# Patient Record
Sex: Female | Born: 1950 | Race: White | Hispanic: No | Marital: Married | Smoking: Never smoker
Health system: Southern US, Community
[De-identification: ages and names within clinical notes are randomized; demographics above are authoritative.]

## PROBLEM LIST (undated history)

## (undated) DIAGNOSIS — E039 Hypothyroidism, unspecified: Secondary | ICD-10-CM

## (undated) DIAGNOSIS — Z860100 Personal history of colon polyps, unspecified: Secondary | ICD-10-CM

## (undated) DIAGNOSIS — F419 Anxiety disorder, unspecified: Secondary | ICD-10-CM

## (undated) DIAGNOSIS — R519 Headache, unspecified: Secondary | ICD-10-CM

## (undated) DIAGNOSIS — E785 Hyperlipidemia, unspecified: Secondary | ICD-10-CM

## (undated) DIAGNOSIS — K219 Gastro-esophageal reflux disease without esophagitis: Secondary | ICD-10-CM

## (undated) DIAGNOSIS — M1990 Unspecified osteoarthritis, unspecified site: Secondary | ICD-10-CM

## (undated) DIAGNOSIS — F32A Depression, unspecified: Secondary | ICD-10-CM

## (undated) HISTORY — PX: BLADDER REPAIR: SHX76

## (undated) HISTORY — PX: ABDOMINAL HYSTERECTOMY: SHX81

## (undated) HISTORY — PX: OTHER SURGICAL HISTORY: SHX169

## (undated) HISTORY — DX: Hyperlipidemia, unspecified: E78.5

## (undated) HISTORY — DX: Personal history of colon polyps, unspecified: Z86.0100

## (undated) HISTORY — PX: CHOLECYSTECTOMY: SHX55

## (undated) HISTORY — DX: Personal history of colonic polyps: Z86.010

---

## 1979-04-14 HISTORY — PX: TONSILLECTOMY: SUR1361

## 1999-09-12 ENCOUNTER — Encounter: Payer: Self-pay | Admitting: Obstetrics & Gynecology

## 1999-09-12 ENCOUNTER — Encounter: Admission: RE | Admit: 1999-09-12 | Discharge: 1999-09-12 | Payer: Self-pay | Admitting: Obstetrics & Gynecology

## 2001-03-31 ENCOUNTER — Other Ambulatory Visit: Admission: RE | Admit: 2001-03-31 | Discharge: 2001-03-31 | Payer: Self-pay | Admitting: Obstetrics & Gynecology

## 2002-04-28 ENCOUNTER — Other Ambulatory Visit: Admission: RE | Admit: 2002-04-28 | Discharge: 2002-04-28 | Payer: Self-pay | Admitting: Obstetrics & Gynecology

## 2003-07-25 ENCOUNTER — Other Ambulatory Visit: Admission: RE | Admit: 2003-07-25 | Discharge: 2003-07-25 | Payer: Self-pay | Admitting: Obstetrics & Gynecology

## 2004-09-16 ENCOUNTER — Ambulatory Visit (HOSPITAL_BASED_OUTPATIENT_CLINIC_OR_DEPARTMENT_OTHER): Admission: RE | Admit: 2004-09-16 | Discharge: 2004-09-16 | Payer: Self-pay | Admitting: Orthopedic Surgery

## 2004-11-13 ENCOUNTER — Other Ambulatory Visit: Admission: RE | Admit: 2004-11-13 | Discharge: 2004-11-13 | Payer: Self-pay | Admitting: Obstetrics & Gynecology

## 2009-10-08 ENCOUNTER — Encounter: Admission: RE | Admit: 2009-10-08 | Discharge: 2009-10-08 | Payer: Self-pay | Admitting: Orthopedic Surgery

## 2010-08-29 NOTE — Op Note (Signed)
NAMEKENNLEY, Deborah Hanna             ACCOUNT NO.:  [id number]   MEDICAL RECORD NO.:  [id number]          PATIENT TYPE:  AMB   LOCATION:  DSC                          FACILITY:  [facility]   PHYSICIAN:  Katy Fitch. Sypher, M.D. DATE OF BIRTH:  1950/12/27   DATE OF PROCEDURE:  09/16/2004  DATE OF DISCHARGE:  09/16/2004                                 OPERATIVE REPORT   PREOPERATIVE DIAGNOSIS:  Foreign body granuloma with retained wood tooth  pick fragment, left index finger, radial pulp.   POSTOPERATIVE DIAGNOSIS:  Foreign body granuloma with retained wood tooth  pick fragment, left index finger, radial pulp.   OPERATION:  Resection of foreign body granuloma and wooden foreign body left  index radial pulp.   SURGEON:  Katy Fitch. Sypher, M.D.   ASSISTANT:  Molly Maduro Dasnoit, P.A.-C.   ANESTHESIA:  2% lidocaine metacarpal head level block left index finger, no  sedation was otherwise provided, this was performed in the minor operating  room.   INDICATIONS FOR PROCEDURE:  [name] is a 60 year old woman who,  several weeks prior, pierced the radial aspect of her left index finger with  a tooth pick.  She attempted to remove this at home but was unsuccessful.  She has subsequently developed a firm mass on the radial aspect of her left  index finger distal segment that had drained intermittently.  She sought a  hand surgery consult seeking removal.  After informed consent, she is  brought to the operating room at this time.   PROCEDURE:  Dulse Rutan is brought to the operating room and placed in  the supine position on the operating table.  Following Betadine prep of the  finger, 2% lidocaine was infiltrated at the metacarpal head level to obtain  a digital block.  When anesthesia was satisfactory, the finger and hand were  prepped with Betadine solution and sterilely draped.  The index finger was  exsanguinated with a  gauze wrap and a 1/2 inch Penrose drain placed at the  base of  the finger as a digital tourniquet.  The procedure commenced with an  elliptical excision of skin directly over the granuloma.  The subcutaneous  tissues were carefully divided revealing an obvious foreign body granuloma.  The wood foreign body was easily removed.  A micro-rongeur was used to  remove granulation tissue.  The wound was closed with a simple single stitch  of 5-0 nylon.  The finger was then Xeroform, sterile gauze, and Coban.  The  tourniquet was released with immediate capillary refill.  Ms. Ralls is  advised to elevate her hand for five days.  She is given Darvocet N100, 1 p.o. q.4-6h. p.r.n. pain, 20 tablets without  refill.  She will return to our office for follow up in one week.  She has  been advised that on Friday, September 19, 2004, she may remove the bandage and  place the Band-Aid.       RVS/MEDQ  D:  09/18/2004  T:  09/18/2004  Job:  [id number]

## 2012-04-19 ENCOUNTER — Other Ambulatory Visit: Payer: Self-pay | Admitting: Obstetrics & Gynecology

## 2012-04-19 DIAGNOSIS — R928 Other abnormal and inconclusive findings on diagnostic imaging of breast: Secondary | ICD-10-CM

## 2012-04-27 ENCOUNTER — Ambulatory Visit
Admission: RE | Admit: 2012-04-27 | Discharge: 2012-04-27 | Disposition: A | Discharge status: Home / Self Care | Payer: Commercial Managed Care - PPO | Source: Ambulatory Visit | Attending: Obstetrics & Gynecology | Admitting: Obstetrics & Gynecology

## 2012-04-27 DIAGNOSIS — R928 Other abnormal and inconclusive findings on diagnostic imaging of breast: Secondary | ICD-10-CM

## 2012-11-14 HISTORY — PX: COLONOSCOPY: SHX5424

## 2012-12-19 HISTORY — PX: ESOPHAGOGASTRODUODENOSCOPY: SHX1529

## 2017-08-26 ENCOUNTER — Other Ambulatory Visit: Payer: Self-pay | Admitting: Orthopedic Surgery

## 2017-08-26 DIAGNOSIS — M67432 Ganglion, left wrist: Secondary | ICD-10-CM

## 2017-08-27 ENCOUNTER — Ambulatory Visit
Admission: RE | Admit: 2017-08-27 | Discharge: 2017-08-27 | Disposition: A | Discharge status: Home / Self Care | Payer: Medicare Other | Source: Ambulatory Visit | Attending: Orthopedic Surgery | Admitting: Orthopedic Surgery

## 2017-08-27 DIAGNOSIS — M67432 Ganglion, left wrist: Secondary | ICD-10-CM

## 2018-11-01 IMAGING — US US EXTREM UP*L* COMP
1 series · 13 of 13 positions shown · non-contrast
Comparison: None.

CLINICAL DATA: Pain and mass at the volar radial aspect of the left
wrist.

EXAM:
ULTRASOUND LEFT UPPER EXTREMITY COMPLETE
TECHNIQUE: Ultrasound examination was performed including evaluation of the
muscles, tendons, joint, and adjacent soft tissues.

[Series 1: us extrem up*left* comp · 13 acquisitions, 13 frames shown]
[im 1/13]
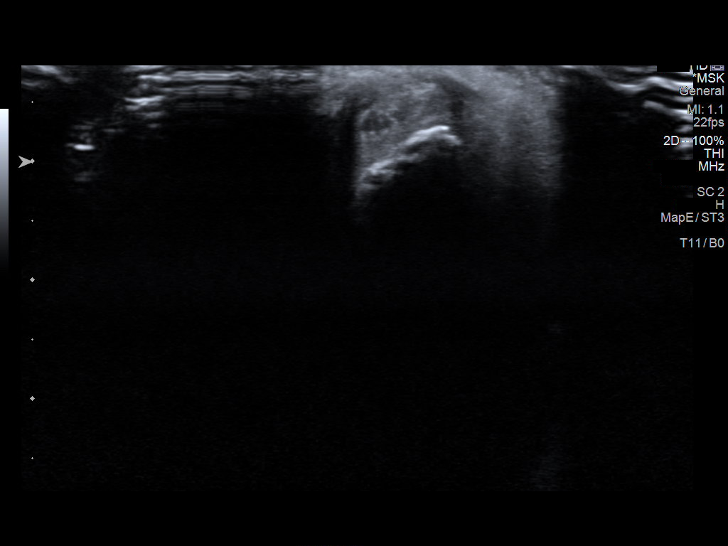
[im 2/13]
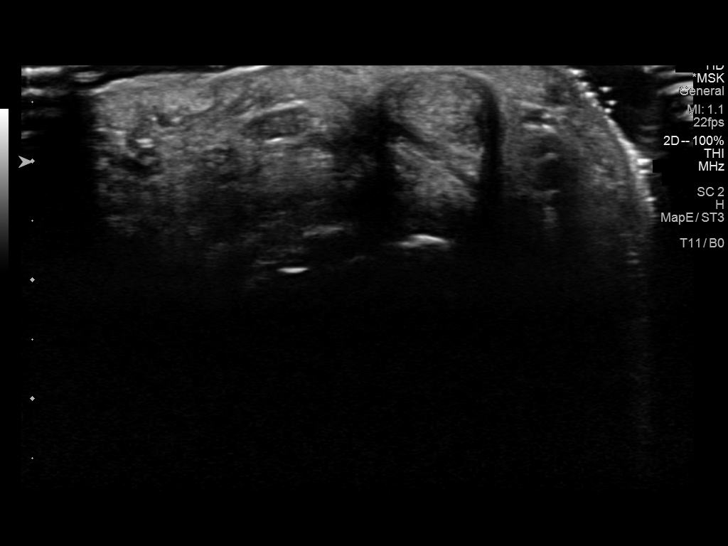
[im 3/13]
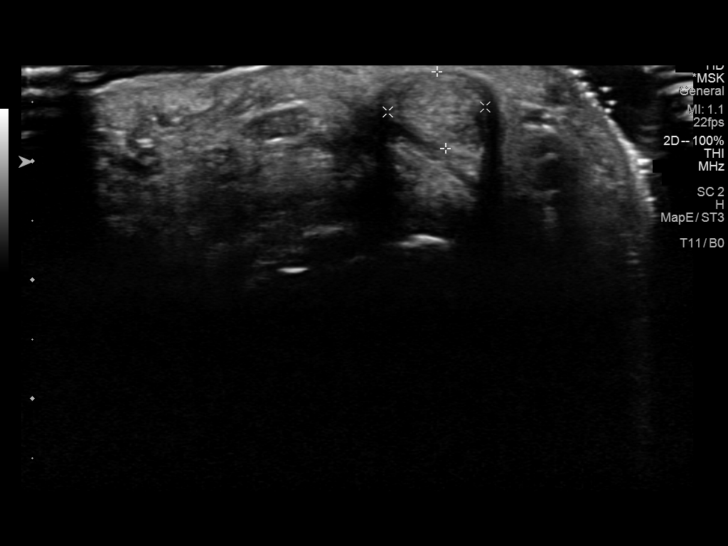
[im 4/13]
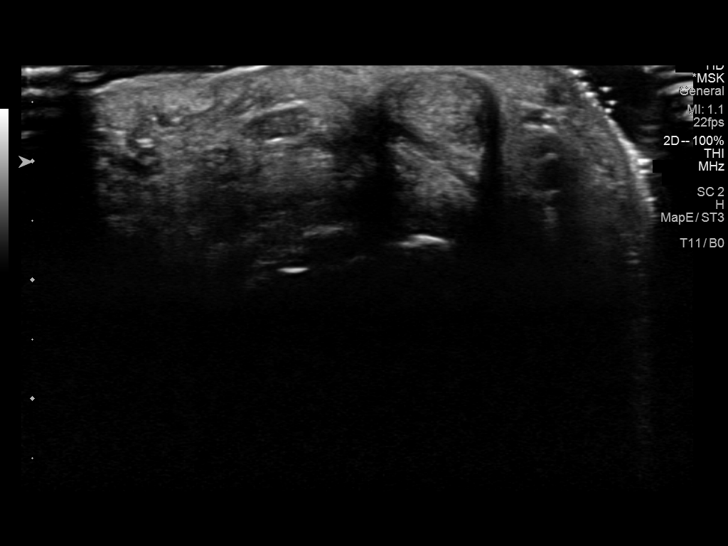
[im 5/13]
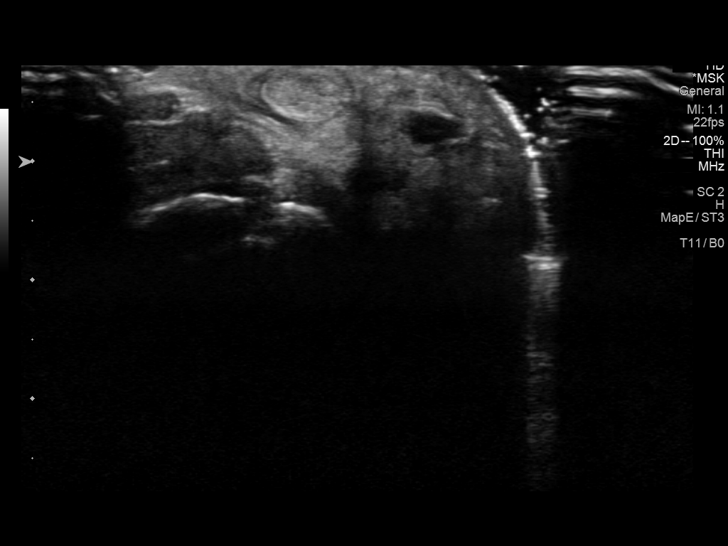
[im 6/13]
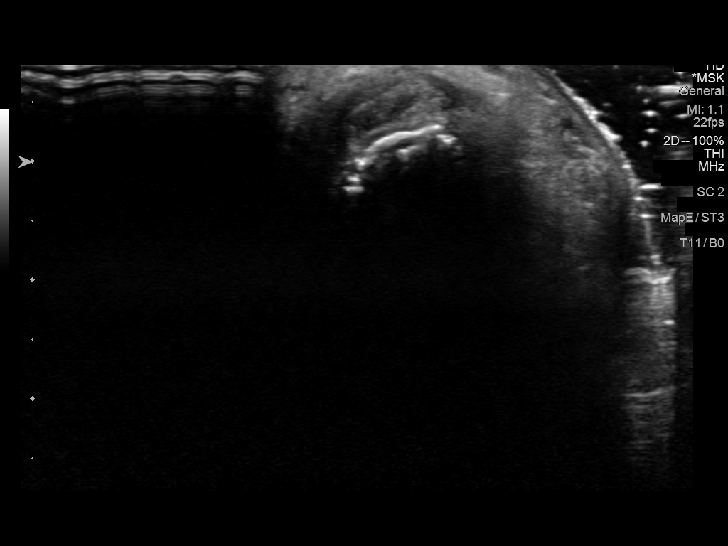
[im 7/13]
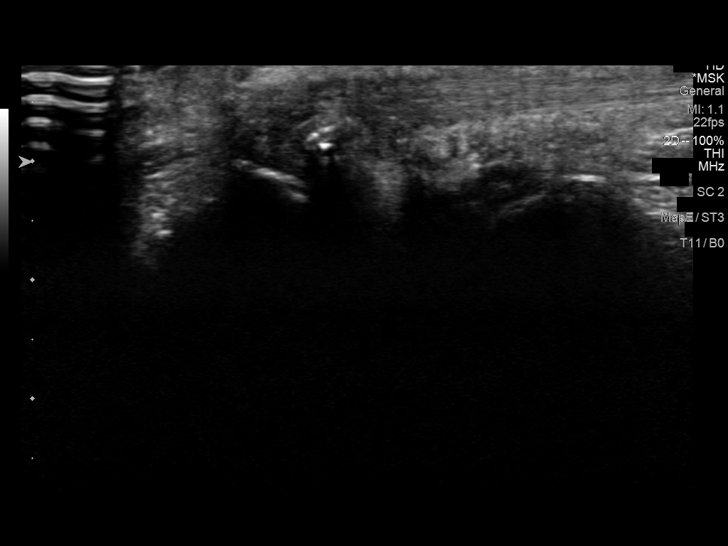
[im 8/13]
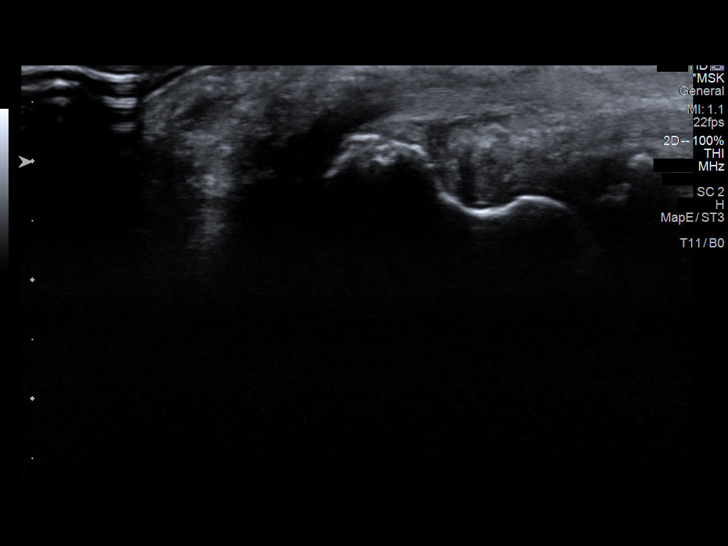
[im 9/13]
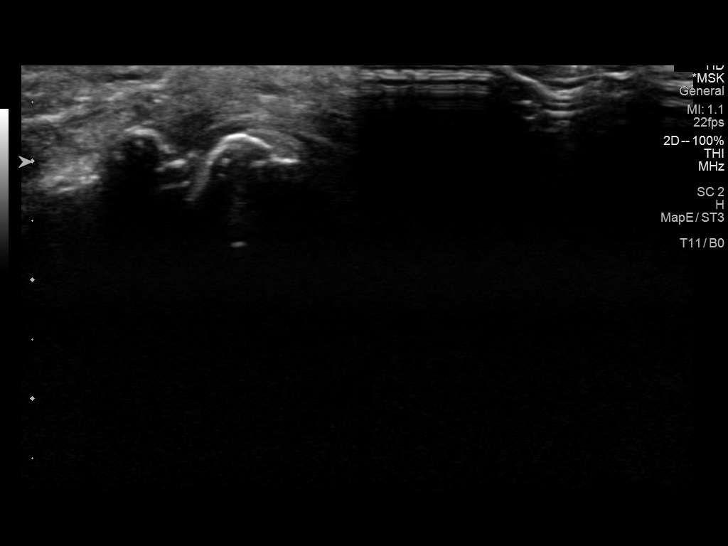
[im 10/13]
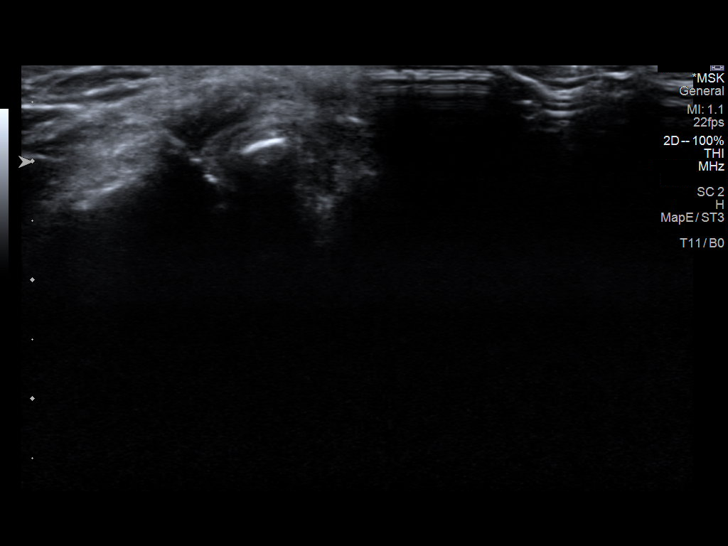
[im 11/13]
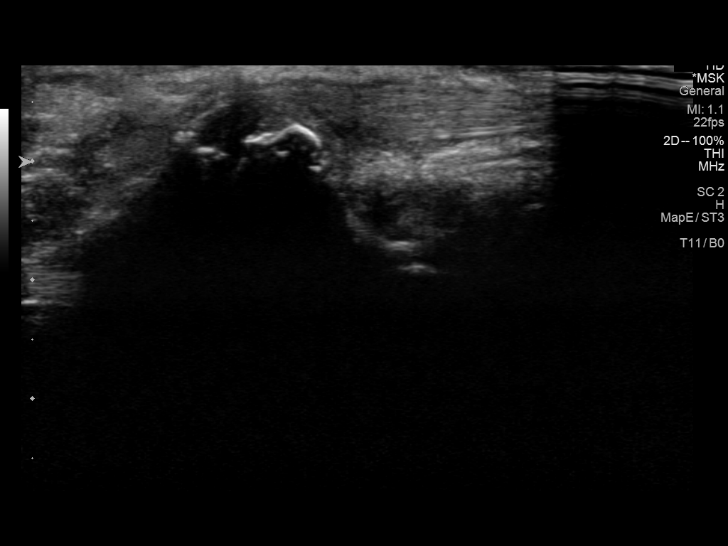
[im 12/13]
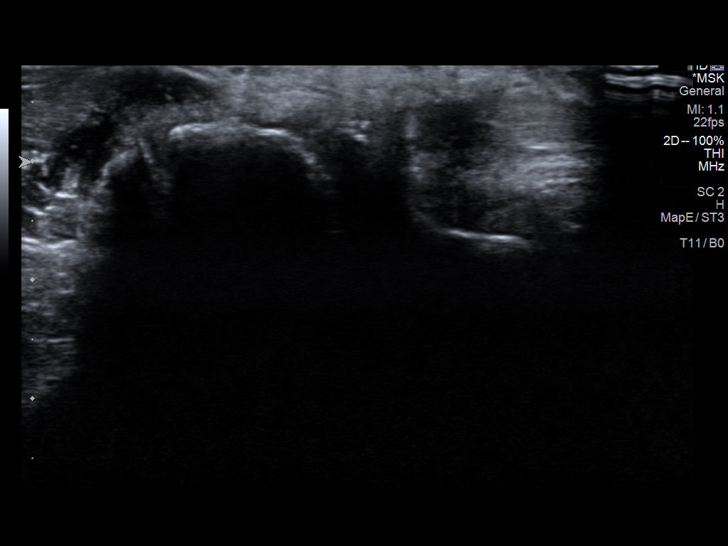
[im 13/13]
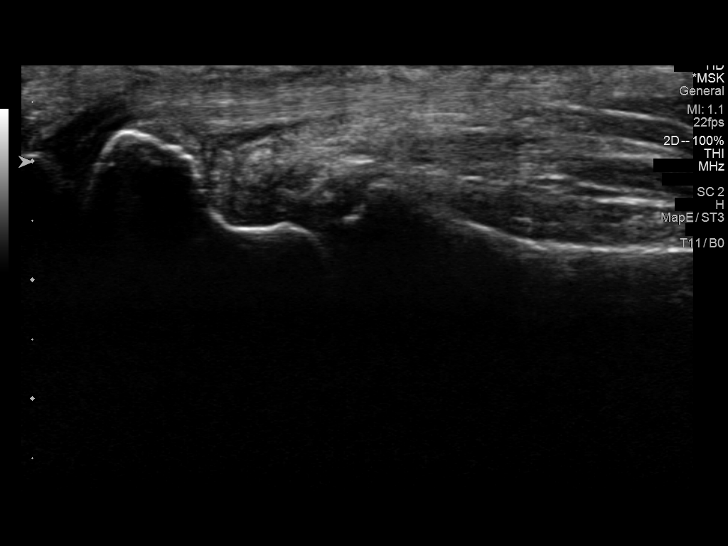

[13 of 13 positions shown; findings below may reference images not displayed]

FINDINGS: Tendons: There is hypertrophy of flexor pollicis longus tendon at
the at the level of radiocarpal joint. This accounts for the soft
tissue prominence. The tendon measures approximately 8 mm maximum
diameter. There is a small amount of fluid in the tendon sheath.
There is prominence of the distal scaphoid and of the volar aspect
of trapezium just deep to the tendon best seen on image 14.

Other Soft Tissue Structures: There is no ganglion cyst or other
soft tissue mass. No appreciable joint effusions.
IMPRESSION: 1. The palpable abnormality of concern represents the hypertrophied
flexor pollicis longus tendon with a small amount of fluid in the
tendon sheath.
2. The area appears more prominent because of underlying osteophytes
on the volar aspect of the distal scaphoid and the adjacent
trapezium.
3. No appreciable ganglion cyst or joint effusion.

## 2021-03-13 ENCOUNTER — Other Ambulatory Visit: Payer: Self-pay | Admitting: Orthopedic Surgery

## 2021-03-14 NOTE — Progress Notes (Signed)
Sent message, via epic in basket, requesting orders in epic from surgeon.  

## 2021-03-21 NOTE — Progress Notes (Signed)
DUE TO COVID-19 ONLY ONE VISITOR IS ALLOWED TO COME WITH YOU AND STAY IN THE WAITING ROOM ONLY DURING PRE OP AND PROCEDURE DAY OF SURGERY.  2 VISITOR  MAY VISIT WITH YOU AFTER SURGERY IN YOUR PRIVATE ROOM DURING VISITING HOURS ONLY!  YOU NEED TO HAVE A COVID 19 TEST ON______AME@_  @_from  8am-3pm _____, THIS TEST MUST BE DONE BEFORE SURGERY,  Covid test is done at [place], [place] [address].  This is a drive thru.  No appt required. Please see map.                 Your procedure is scheduled on:    Report to [place]  Entrance   Report to admitting at AM     Call this number if you have problems the morning of surgery [phone]    REMEMBER: NO  SOLID FOOD CANDY OR GUM AFTER MIDNIGHT. CLEAR LIQUIDS UNTIL         . NOTHING BY MOUTH EXCEPT CLEAR LIQUIDS UNTIL    . PLEASE FINISH ENSURE DRINK PER SURGEON ORDER  WHICH NEEDS TO BE COMPLETED AT      .      CLEAR LIQUID DIET   Foods Allowed                                                                    Coffee and tea, regular and decaf                            Fruit ices (not with fruit pulp)                                      Iced Popsicles                                    Carbonated beverages, regular and diet                                    Cranberry, grape and apple juices Sports drinks like Gatorade Lightly seasoned clear broth or consume(fat free) Sugar, honey syrup ___________________________________________________________________      BRUSH YOUR TEETH MORNING OF SURGERY AND RINSE YOUR MOUTH OUT, NO CHEWING GUM CANDY OR MINTS.     Take these medicines the morning of surgery with A SIP OF WATER:   DO NOT TAKE ANY DIABETIC MEDICATIONS DAY OF YOUR SURGERY                               You may not have any metal on your body including hair pins and              piercings  Do not wear jewelry, make-up, lotions, powders or perfumes, deodorant             Do not wear nail polish  on your fingernails.  Do not shave  48  hours prior to surgery.              Men may shave face and neck.   Do not bring valuables to the hospital. [place].  Contacts, dentures or bridgework may not be worn into surgery.  Leave suitcase in the car. After surgery it may be brought to your room.     Patients discharged the day of surgery will not be allowed to drive home. IF YOU ARE HAVING SURGERY AND GOING HOME THE SAME DAY, YOU MUST HAVE AN ADULT TO DRIVE YOU HOME AND BE WITH YOU FOR 24 HOURS. YOU MAY GO HOME BY TAXI OR UBER OR ORTHERWISE, BUT AN ADULT MUST ACCOMPANY YOU HOME AND STAY WITH YOU FOR 24 HOURS.  Name and phone number of your driver:  Special Instructions: N/A              Please read over the following fact sheets you were given: _____________________________________________________________________  [facility] - Preparing for Surgery Before surgery, you can play an important role.  Because skin is not sterile, your skin needs to be as free of germs as possible.  You can reduce the number of germs on your skin by washing with CHG (chlorahexidine gluconate) soap before surgery.  CHG is an antiseptic cleaner which kills germs and bonds with the skin to continue killing germs even after washing. Please DO NOT use if you have an allergy to CHG or antibacterial soaps.  If your skin becomes reddened/irritated stop using the CHG and inform your nurse when you arrive at Short Stay. Do not shave (including legs and underarms) for at least 48 hours prior to the first CHG shower.  You may shave your face/neck. Please follow these instructions carefully:  1.  Shower with CHG Soap the night before surgery and the  morning of Surgery.  2.  If you choose to wash your hair, wash your hair first as usual with your  normal  shampoo.  3.  After you shampoo, rinse your hair and body thoroughly to remove the  shampoo.                           4.  Use  CHG as you would any other liquid soap.  You can apply chg directly  to the skin and wash                       Gently with a scrungie or clean washcloth.  5.  Apply the CHG Soap to your body ONLY FROM THE NECK DOWN.   Do not use on face/ open                           Wound or open sores. Avoid contact with eyes, ears mouth and genitals (private parts).                       Wash face,  Genitals (private parts) with your normal soap.             6.  Wash thoroughly, paying special attention to the area where your surgery  will be performed.  7.  Thoroughly rinse your body with warm water from the neck down.  8.  DO NOT shower/wash with your  normal soap after using and rinsing off  the CHG Soap.                9.  Pat yourself dry with a clean towel.            10.  Wear clean pajamas.            11.  Place clean sheets on your bed the night of your first shower and do not  sleep with pets. Day of Surgery : Do not apply any lotions/deodorants the morning of surgery.  Please wear clean clothes to the hospital/surgery center.  FAILURE TO FOLLOW THESE INSTRUCTIONS MAY RESULT IN THE CANCELLATION OF YOUR SURGERY PATIENT SIGNATURE_________________________________  NURSE SIGNATURE__________________________________  ________________________________________________________________________               Your procedure is scheduled on:    03/27/2021   Report to [facility] Main  Entrance   Report to admitting at   [phone]     Call this number if you have problems the morning of surgery [phone]    REMEMBER: NO  SOLID FOOD CANDY OR GUM AFTER MIDNIGHT. CLEAR LIQUIDS UNTIL 06303am          . NOTHING BY MOUTH EXCEPT CLEAR LIQUIDS UNTIL 0630am   . PLEASE FINISH ENSURE DRINK PER SURGEON ORDER  WHICH NEEDS TO BE COMPLETED AT   06303am    .      CLEAR LIQUID DIET   Foods Allowed                                                                    Coffee and tea, regular and decaf                             Fruit ices (not with fruit pulp)                                      Iced Popsicles                                    Carbonated beverages, regular and diet                                    Cranberry, grape and apple juices Sports drinks like Gatorade Lightly seasoned clear broth or consume(fat free) Sugar, honey syrup ___________________________________________________________________      BRUSH YOUR TEETH MORNING OF SURGERY AND RINSE YOUR MOUTH OUT, NO CHEWING GUM CANDY OR MINTS.     Take these medicines the morning of surgery with A SIP OF WATER:  lexapro, synthroid, euye drops as usual,, prilosec   DO NOT TAKE ANY DIABETIC MEDICATIONS DAY OF YOUR SURGERY                               You may not have any metal on your body including hair pins and  piercings  Do not wear jewelry, make-up, lotions, powders or perfumes, deodorant             Do not wear nail polish on your fingernails.  Do not shave  48 hours prior to surgery.              Men may shave face and neck.   Do not bring valuables to the hospital. [place].  Contacts, dentures or bridgework may not be worn into surgery.  Leave suitcase in the car. After surgery it may be brought to your room.     Patients discharged the day of surgery will not be allowed to drive home. IF YOU ARE HAVING SURGERY AND GOING HOME THE SAME DAY, YOU MUST HAVE AN ADULT TO DRIVE YOU HOME AND BE WITH YOU FOR 24 HOURS. YOU MAY GO HOME BY TAXI OR UBER OR ORTHERWISE, BUT AN ADULT MUST ACCOMPANY YOU HOME AND STAY WITH YOU FOR 24 HOURS.  Name and phone number of your driver:  Special Instructions: N/A              Please read over the following fact sheets you were given: _____________________________________________________________________  [facility] - Preparing for Surgery Before surgery, you can play an important role.  Because skin is not sterile, your  skin needs to be as free of germs as possible.  You can reduce the number of germs on your skin by washing with CHG (chlorahexidine gluconate) soap before surgery.  CHG is an antiseptic cleaner which kills germs and bonds with the skin to continue killing germs even after washing. Please DO NOT use if you have an allergy to CHG or antibacterial soaps.  If your skin becomes reddened/irritated stop using the CHG and inform your nurse when you arrive at Short Stay. Do not shave (including legs and underarms) for at least 48 hours prior to the first CHG shower.  You may shave your face/neck. Please follow these instructions carefully:  1.  Shower with CHG Soap the night before surgery and the  morning of Surgery.  2.  If you choose to wash your hair, wash your hair first as usual with your  normal  shampoo.  3.  After you shampoo, rinse your hair and body thoroughly to remove the  shampoo.                           4.  Use CHG as you would any other liquid soap.  You can apply chg directly  to the skin and wash                       Gently with a scrungie or clean washcloth.  5.  Apply the CHG Soap to your body ONLY FROM THE NECK DOWN.   Do not use on face/ open                           Wound or open sores. Avoid contact with eyes, ears mouth and genitals (private parts).                       Wash face,  Genitals (private parts) with your normal soap.             6.  Wash thoroughly, paying special attention to the area where your surgery  will be performed.  7.  Thoroughly rinse your body with warm water from the neck down.  8.  DO NOT shower/wash with your normal soap after using and rinsing off  the CHG Soap.                9.  Pat yourself dry with a clean towel.            10.  Wear clean pajamas.            11.  Place clean sheets on your bed the night of your first shower and do not  sleep with pets. Day of Surgery : Do not apply any lotions/deodorants the morning of surgery.  Please wear  clean clothes to the hospital/surgery center.  FAILURE TO FOLLOW THESE INSTRUCTIONS MAY RESULT IN THE CANCELLATION OF YOUR SURGERY PATIENT SIGNATURE_________________________________  NURSE SIGNATURE__________________________________  ________________________________________________________________________

## 2021-03-21 NOTE — Progress Notes (Signed)
[facility]- Preparing for Total Shoulder Arthroplasty  °  °Before surgery, you can play an important role. Because skin is not sterile, your skin needs to be as free of germs as possible. You can reduce the number of germs on your skin by using the following products. °Benzoyl Peroxide Gel °Reduces the number of germs present on the skin °Applied twice a day to shoulder area starting two days before surgery   ° °================================================================== ° °Please follow these instructions carefully: ° °BENZOYL PEROXIDE 5% GEL ° °Please do not use if you have an allergy to benzoyl peroxide.   If your skin becomes reddened/irritated stop using the benzoyl peroxide. ° °Starting two days before surgery, apply as follows: °Apply benzoyl peroxide in the morning and at night. Apply after taking a shower. If you are not taking a shower clean entire shoulder front, back, and side along with the armpit with a clean wet washcloth. ° °Place a quarter-sized dollop on your shoulder and rub in thoroughly, making sure to cover the front, back, and side of your shoulder, along with the armpit.  ° °2 days before ____ AM   ____ PM              1 day before ____ AM   ____ PM °                        °Do this twice a day for two days.  (Last application is the night before surgery, AFTER using the CHG soap as described below). ° °Do NOT apply benzoyl peroxide gel on the day of surgery.  °

## 2021-03-24 ENCOUNTER — Ambulatory Visit (HOSPITAL_COMMUNITY)
Admission: RE | Admit: 2021-03-24 | Discharge: 2021-03-24 | Disposition: A | Discharge status: Home / Self Care | Payer: Medicare Other | Source: Ambulatory Visit | Attending: Orthopedic Surgery | Admitting: Orthopedic Surgery

## 2021-03-24 ENCOUNTER — Encounter (HOSPITAL_COMMUNITY)
Admission: RE | Admit: 2021-03-24 | Discharge: 2021-03-24 | Disposition: A | Discharge status: Home / Self Care | Payer: Medicare Other | Source: Ambulatory Visit | Attending: Orthopedic Surgery | Admitting: Orthopedic Surgery

## 2021-03-24 ENCOUNTER — Encounter (HOSPITAL_COMMUNITY): Payer: Self-pay

## 2021-03-24 ENCOUNTER — Other Ambulatory Visit: Payer: Self-pay

## 2021-03-24 VITALS — BP 123/82 | HR 77 | Temp 98.3°F | Resp 16 | Ht 63.0 in | Wt 170.0 lb

## 2021-03-24 DIAGNOSIS — Z01818 Encounter for other preprocedural examination: Secondary | ICD-10-CM

## 2021-03-24 DIAGNOSIS — Z01811 Encounter for preprocedural respiratory examination: Secondary | ICD-10-CM | POA: Diagnosis present

## 2021-03-24 HISTORY — DX: Anxiety disorder, unspecified: F41.9

## 2021-03-24 HISTORY — DX: Depression, unspecified: F32.A

## 2021-03-24 HISTORY — DX: Gastro-esophageal reflux disease without esophagitis: K21.9

## 2021-03-24 HISTORY — DX: Headache, unspecified: R51.9

## 2021-03-24 HISTORY — DX: Unspecified osteoarthritis, unspecified site: M19.90

## 2021-03-24 HISTORY — DX: Hypothyroidism, unspecified: E03.9

## 2021-03-24 LAB — CBC
HCT: 47.8 % — ABNORMAL HIGH (ref 36.0–46.0)
Hemoglobin: 16.1 g/dL — ABNORMAL HIGH (ref 12.0–15.0)
MCH: 31 pg (ref 26.0–34.0)
MCHC: 33.7 g/dL (ref 30.0–36.0)
MCV: 92.1 fL (ref 80.0–100.0)
Platelets: 235 10*3/uL (ref 150–400)
RBC: 5.19 MIL/uL — ABNORMAL HIGH (ref 3.87–5.11)
RDW: 12.4 % (ref 11.5–15.5)
WBC: 6.7 10*3/uL (ref 4.0–10.5)
nRBC: 0 % (ref 0.0–0.2)

## 2021-03-24 LAB — SURGICAL PCR SCREEN
MRSA, PCR: NEGATIVE
Staphylococcus aureus: NEGATIVE

## 2021-03-24 LAB — BASIC METABOLIC PANEL
Anion gap: 10 (ref 5–15)
BUN: 15 mg/dL (ref 8–23)
CO2: 26 mmol/L (ref 22–32)
Calcium: 9.8 mg/dL (ref 8.9–10.3)
Chloride: 103 mmol/L (ref 98–111)
Creatinine, Ser: 0.71 mg/dL (ref 0.44–1.00)
GFR, Estimated: >60 mL/min (ref >60)
Glucose, Bld: 98 mg/dL (ref 70–99)
Potassium: 4.4 mmol/L (ref 3.5–5.1)
Sodium: 139 mmol/L (ref 135–145)

## 2021-03-24 NOTE — Progress Notes (Addendum)
Anesthesia Review:  PCP: DR Janace Litten in [place]  Cardiologist : none  Chest x-ray :03/24/21  EKG :03/24/21  Echo : Stress test: Cardiac Cath :  Activity level:  can do a flight of stairs wtihout difdficulty  Sleep Study/ CPAP : none  Fasting Blood Sugar :      / Checks Blood Sugar -- times a day:   Blood Thinner/ Instructions /Last Dose: ASA / Instructions/ Last Dose :  No covid test ambulatory surgery

## 2021-03-27 ENCOUNTER — Ambulatory Visit (HOSPITAL_COMMUNITY): Payer: Medicare Other

## 2021-03-27 ENCOUNTER — Encounter (HOSPITAL_COMMUNITY): Payer: Self-pay | Admitting: Orthopedic Surgery

## 2021-03-27 ENCOUNTER — Encounter (HOSPITAL_COMMUNITY)
Admission: RE | Disposition: A | Discharge status: Home / Self Care | Payer: Self-pay | Source: Home / Self Care | Attending: Orthopedic Surgery

## 2021-03-27 ENCOUNTER — Ambulatory Visit (HOSPITAL_COMMUNITY)
Admission: RE | Admit: 2021-03-27 | Discharge: 2021-03-27 | Disposition: A | Discharge status: Home / Self Care | Payer: Medicare Other | Attending: Orthopedic Surgery | Admitting: Orthopedic Surgery

## 2021-03-27 ENCOUNTER — Ambulatory Visit (HOSPITAL_COMMUNITY): Payer: Medicare Other | Admitting: Physician Assistant

## 2021-03-27 ENCOUNTER — Ambulatory Visit (HOSPITAL_COMMUNITY): Payer: Medicare Other | Admitting: Anesthesiology

## 2021-03-27 DIAGNOSIS — F419 Anxiety disorder, unspecified: Secondary | ICD-10-CM | POA: Diagnosis not present

## 2021-03-27 DIAGNOSIS — M129 Arthropathy, unspecified: Secondary | ICD-10-CM | POA: Diagnosis present

## 2021-03-27 DIAGNOSIS — E039 Hypothyroidism, unspecified: Secondary | ICD-10-CM | POA: Diagnosis not present

## 2021-03-27 DIAGNOSIS — M19011 Primary osteoarthritis, right shoulder: Secondary | ICD-10-CM | POA: Diagnosis not present

## 2021-03-27 DIAGNOSIS — K219 Gastro-esophageal reflux disease without esophagitis: Secondary | ICD-10-CM | POA: Insufficient documentation

## 2021-03-27 DIAGNOSIS — Z79899 Other long term (current) drug therapy: Secondary | ICD-10-CM | POA: Diagnosis not present

## 2021-03-27 DIAGNOSIS — Z96611 Presence of right artificial shoulder joint: Secondary | ICD-10-CM

## 2021-03-27 DIAGNOSIS — M25711 Osteophyte, right shoulder: Secondary | ICD-10-CM | POA: Diagnosis not present

## 2021-03-27 DIAGNOSIS — F32A Depression, unspecified: Secondary | ICD-10-CM | POA: Insufficient documentation

## 2021-03-27 HISTORY — PX: REVERSE SHOULDER ARTHROPLASTY: SHX5054

## 2021-03-27 LAB — TYPE AND SCREEN
ABO/RH(D): A POS
Antibody Screen: NEGATIVE

## 2021-03-27 LAB — ABO/RH: ABO/RH(D): A POS

## 2021-03-27 SURGERY — ARTHROPLASTY, SHOULDER, TOTAL, REVERSE
Anesthesia: Regional | Site: Shoulder | Laterality: Right

## 2021-03-27 MED ORDER — WATER FOR IRRIGATION, STERILE IR SOLN
Status: DC | PRN
  Administered 2021-03-27: 2000 mL

## 2021-03-27 MED ORDER — OXYCODONE HCL 5 MG/5ML PO SOLN: 5.0000 mg | Freq: Once | ORAL | Status: DC | PRN

## 2021-03-27 MED ORDER — FENTANYL CITRATE PF 50 MCG/ML IJ SOSY
50.0000 ug | PREFILLED_SYRINGE | Freq: Once | INTRAMUSCULAR | Status: AC
  Administered 2021-03-27: 50 ug via INTRAVENOUS
  Filled 2021-03-27: qty 2

## 2021-03-27 MED ORDER — ALBUTEROL SULFATE HFA 108 (90 BASE) MCG/ACT IN AERS
INHALATION_SPRAY | RESPIRATORY_TRACT | Status: DC | PRN
  Administered 2021-03-27: 5 via RESPIRATORY_TRACT

## 2021-03-27 MED ORDER — PHENYLEPHRINE HCL-NACL 20-0.9 MG/250ML-% IV SOLN
INTRAVENOUS | Status: DC | PRN
  Administered 2021-03-27: 30 ug/min via INTRAVENOUS

## 2021-03-27 MED ORDER — METHOCARBAMOL 500 MG IVPB - SIMPLE MED
INTRAVENOUS | Status: AC
  Filled 2021-03-27: qty 50

## 2021-03-27 MED ORDER — BUPIVACAINE HCL (PF) 0.5 % IJ SOLN
INTRAMUSCULAR | Status: DC | PRN
  Administered 2021-03-27: 15 mL via PERINEURAL

## 2021-03-27 MED ORDER — LIDOCAINE HCL (PF) 2 % IJ SOLN
INTRAMUSCULAR | Status: AC
  Filled 2021-03-27: qty 5

## 2021-03-27 MED ORDER — PROPOFOL 10 MG/ML IV BOLUS
INTRAVENOUS | Status: AC
  Filled 2021-03-27: qty 20

## 2021-03-27 MED ORDER — MEPERIDINE HCL 50 MG/ML IJ SOLN: 6.2500 mg | INTRAMUSCULAR | Status: DC | PRN

## 2021-03-27 MED ORDER — PROPOFOL 10 MG/ML IV BOLUS
INTRAVENOUS | Status: DC | PRN
  Administered 2021-03-27: 50 mg via INTRAVENOUS
  Administered 2021-03-27: 150 mg via INTRAVENOUS

## 2021-03-27 MED ORDER — FENTANYL CITRATE (PF) 100 MCG/2ML IJ SOLN
INTRAMUSCULAR | Status: AC
  Filled 2021-03-27: qty 2

## 2021-03-27 MED ORDER — ACETAMINOPHEN 160 MG/5ML PO SOLN: 325.0000 mg | ORAL | Status: DC | PRN

## 2021-03-27 MED ORDER — OXYCODONE HCL 5 MG PO TABS: 5.0000 mg | ORAL_TABLET | ORAL | 0 refills | Status: DC | PRN

## 2021-03-27 MED ORDER — ACETAMINOPHEN 10 MG/ML IV SOLN
INTRAVENOUS | Status: AC
  Filled 2021-03-27: qty 100

## 2021-03-27 MED ORDER — METHOCARBAMOL 500 MG PO TABS: 500.0000 mg | ORAL_TABLET | Freq: Four times a day (QID) | ORAL | Status: DC | PRN

## 2021-03-27 MED ORDER — TIZANIDINE HCL 4 MG PO TABS: 4.0000 mg | ORAL_TABLET | Freq: Four times a day (QID) | ORAL | 0 refills | Status: AC | PRN

## 2021-03-27 MED ORDER — SODIUM CHLORIDE 0.9 % IR SOLN
Status: DC | PRN
  Administered 2021-03-27: 1000 mL

## 2021-03-27 MED ORDER — ONDANSETRON HCL 4 MG/2ML IJ SOLN
INTRAMUSCULAR | Status: AC
  Filled 2021-03-27: qty 2

## 2021-03-27 MED ORDER — CEFAZOLIN SODIUM-DEXTROSE 2-4 GM/100ML-% IV SOLN
2.0000 g | INTRAVENOUS | Status: AC
  Administered 2021-03-27: 2 g via INTRAVENOUS
  Filled 2021-03-27: qty 100

## 2021-03-27 MED ORDER — ROCURONIUM BROMIDE 10 MG/ML (PF) SYRINGE
PREFILLED_SYRINGE | INTRAVENOUS | Status: AC
  Filled 2021-03-27: qty 10

## 2021-03-27 MED ORDER — OXYCODONE HCL 5 MG PO TABS: 5.0000 mg | ORAL_TABLET | Freq: Once | ORAL | Status: DC | PRN

## 2021-03-27 MED ORDER — LIDOCAINE 2% (20 MG/ML) 5 ML SYRINGE
INTRAMUSCULAR | Status: DC | PRN
  Administered 2021-03-27: 60 mg via INTRAVENOUS

## 2021-03-27 MED ORDER — ACETAMINOPHEN 325 MG PO TABS: 325.0000 mg | ORAL_TABLET | ORAL | Status: DC | PRN

## 2021-03-27 MED ORDER — OXYCODONE HCL 5 MG PO TABS
ORAL_TABLET | ORAL | Status: AC
  Filled 2021-03-27: qty 1

## 2021-03-27 MED ORDER — ROCURONIUM BROMIDE 10 MG/ML (PF) SYRINGE
PREFILLED_SYRINGE | INTRAVENOUS | Status: DC | PRN
  Administered 2021-03-27: 60 mg via INTRAVENOUS

## 2021-03-27 MED ORDER — DEXAMETHASONE SODIUM PHOSPHATE 10 MG/ML IJ SOLN
INTRAMUSCULAR | Status: AC
  Filled 2021-03-27: qty 1

## 2021-03-27 MED ORDER — TRANEXAMIC ACID-NACL 1000-0.7 MG/100ML-% IV SOLN
1000.0000 mg | INTRAVENOUS | Status: AC
  Administered 2021-03-27: 1000 mg via INTRAVENOUS
  Filled 2021-03-27: qty 100

## 2021-03-27 MED ORDER — 0.9 % SODIUM CHLORIDE (POUR BTL) OPTIME
TOPICAL | Status: DC | PRN
  Administered 2021-03-27: 1000 mL

## 2021-03-27 MED ORDER — MIDAZOLAM HCL 2 MG/2ML IJ SOLN
1.0000 mg | Freq: Once | INTRAMUSCULAR | Status: AC
  Administered 2021-03-27: 2 mg via INTRAVENOUS
  Filled 2021-03-27: qty 2

## 2021-03-27 MED ORDER — BUPIVACAINE LIPOSOME 1.3 % IJ SUSP
INTRAMUSCULAR | Status: DC | PRN
  Administered 2021-03-27: 10 mL via PERINEURAL

## 2021-03-27 MED ORDER — ONDANSETRON HCL 4 MG/2ML IJ SOLN
INTRAMUSCULAR | Status: DC | PRN
  Administered 2021-03-27: 4 mg via INTRAVENOUS

## 2021-03-27 MED ORDER — PHENYLEPHRINE 40 MCG/ML (10ML) SYRINGE FOR IV PUSH (FOR BLOOD PRESSURE SUPPORT)
PREFILLED_SYRINGE | INTRAVENOUS | Status: DC | PRN
  Administered 2021-03-27: 120 ug via INTRAVENOUS

## 2021-03-27 MED ORDER — METHOCARBAMOL 500 MG IVPB - SIMPLE MED
500.0000 mg | Freq: Four times a day (QID) | INTRAVENOUS | Status: DC | PRN
  Administered 2021-03-27: 500 mg via INTRAVENOUS

## 2021-03-27 MED ORDER — LACTATED RINGERS IV SOLN: INTRAVENOUS | Status: DC | PRN

## 2021-03-27 MED ORDER — ACETAMINOPHEN 10 MG/ML IV SOLN
INTRAVENOUS | Status: DC | PRN
  Administered 2021-03-27: 1000 mg via INTRAVENOUS

## 2021-03-27 MED ORDER — FENTANYL CITRATE (PF) 100 MCG/2ML IJ SOLN
INTRAMUSCULAR | Status: DC | PRN
  Administered 2021-03-27 (×2): 50 ug via INTRAVENOUS

## 2021-03-27 MED ORDER — ONDANSETRON HCL 4 MG/2ML IJ SOLN: 4.0000 mg | Freq: Once | INTRAMUSCULAR | Status: DC | PRN

## 2021-03-27 MED ORDER — FENTANYL CITRATE PF 50 MCG/ML IJ SOSY
PREFILLED_SYRINGE | INTRAMUSCULAR | Status: AC
  Administered 2021-03-27: 50 ug via INTRAVENOUS
  Filled 2021-03-27: qty 2

## 2021-03-27 MED ORDER — EPHEDRINE SULFATE-NACL 50-0.9 MG/10ML-% IV SOSY
PREFILLED_SYRINGE | INTRAVENOUS | Status: DC | PRN
Start: 2021-03-27 — End: 2021-03-27
  Administered 2021-03-27: 5 mg via INTRAVENOUS

## 2021-03-27 MED ORDER — FENTANYL CITRATE PF 50 MCG/ML IJ SOSY
25.0000 ug | PREFILLED_SYRINGE | INTRAMUSCULAR | Status: DC | PRN
  Administered 2021-03-27: 50 ug via INTRAVENOUS

## 2021-03-27 MED ORDER — DEXAMETHASONE SODIUM PHOSPHATE 4 MG/ML IJ SOLN
INTRAMUSCULAR | Status: DC | PRN
  Administered 2021-03-27: 10 mg via INTRAVENOUS

## 2021-03-27 SURGICAL SUPPLY — 77 items
AID PSTN UNV HD RSTRNT DISP (MISCELLANEOUS) ×1
BAG COUNTER SPONGE SURGICOUNT (BAG) ×1 IMPLANT
BAG SPEC THK2 15X12 ZIP CLS (MISCELLANEOUS) ×1
BAG SPNG CNTER NS LX DISP (BAG) ×1
BAG ZIPLOCK 12X15 (MISCELLANEOUS) ×2 IMPLANT
BASEPLATE P2 COATD GLND 6.5X30 (Shoulder) IMPLANT
BIT DRILL 1.6MX128 (BIT) IMPLANT
BIT DRILL 2.5 DIA 127 CALI (BIT) ×1 IMPLANT
BIT DRILL 4 DIA CALIBRATED (BIT) ×1 IMPLANT
BLADE SAW SAG 73X25 THK (BLADE) ×1
BLADE SAW SGTL 73X25 THK (BLADE) ×1 IMPLANT
BOOTIES KNEE HIGH SLOAN (MISCELLANEOUS) ×4 IMPLANT
BSPLAT GLND 30 STRL LF SHLDR (Shoulder) ×1 IMPLANT
CLSR STERI-STRIP ANTIMIC 1/2X4 (GAUZE/BANDAGES/DRESSINGS) ×2 IMPLANT
COOLER ICEMAN CLASSIC (MISCELLANEOUS) ×1 IMPLANT
COVER BACK TABLE 60X90IN (DRAPES) ×2 IMPLANT
COVER SURGICAL LIGHT HANDLE (MISCELLANEOUS) ×2 IMPLANT
DRAPE INCISE IOBAN 66X45 STRL (DRAPES) ×3 IMPLANT
DRAPE ORTHO SPLIT 77X108 STRL (DRAPES) ×4
DRAPE POUCH INSTRU U-SHP 10X18 (DRAPES) ×2 IMPLANT
DRAPE SHEET LG 3/4 BI-LAMINATE (DRAPES) ×2 IMPLANT
DRAPE SURG 17X11 SM STRL (DRAPES) ×2 IMPLANT
DRAPE SURG ORHT 6 SPLT 77X108 (DRAPES) ×2 IMPLANT
DRAPE TOP 10253 STERILE (DRAPES) ×2 IMPLANT
DRAPE U-SHAPE 47X51 STRL (DRAPES) ×2 IMPLANT
DRSG AQUACEL AG ADV 3.5X 6 (GAUZE/BANDAGES/DRESSINGS) ×2 IMPLANT
DURAPREP 26ML APPLICATOR (WOUND CARE) ×4 IMPLANT
ELECT BLADE TIP CTD 4 INCH (ELECTRODE) ×2 IMPLANT
ELECT REM PT RETURN 15FT ADLT (MISCELLANEOUS) ×2 IMPLANT
GLOVE SRG 8 PF TXTR STRL LF DI (GLOVE) ×1 IMPLANT
GLOVE SURG ENC MOIS LTX SZ7.5 (GLOVE) ×2 IMPLANT
GLOVE SURG POLYISO LF SZ6.5 (GLOVE) ×2 IMPLANT
GLOVE SURG UNDER POLY LF SZ6.5 (GLOVE) ×2 IMPLANT
GLOVE SURG UNDER POLY LF SZ8 (GLOVE) ×2
GOWN STRL REUS W/TWL LRG LVL3 (GOWN DISPOSABLE) ×2 IMPLANT
GOWN STRL REUS W/TWL XL LVL3 (GOWN DISPOSABLE) ×2 IMPLANT
HANDPIECE INTERPULSE COAX TIP (DISPOSABLE) ×2
HOOD PEEL AWAY FLYTE STAYCOOL (MISCELLANEOUS) ×6 IMPLANT
HUMERA STEM SM SHELL SHOU 10 (Miscellaneous) ×2 IMPLANT
INSERT SMALL SOCKET 32MM NEU (Insert) ×1 IMPLANT
KIT BASIN OR (CUSTOM PROCEDURE TRAY) ×2 IMPLANT
KIT TURNOVER KIT A (KITS) IMPLANT
MANIFOLD NEPTUNE II (INSTRUMENTS) ×2 IMPLANT
NDL TROCAR POINT SZ 2 1/2 (NEEDLE) IMPLANT
NEEDLE TROCAR POINT SZ 2 1/2 (NEEDLE) IMPLANT
NS IRRIG 1000ML POUR BTL (IV SOLUTION) ×2 IMPLANT
P2 COATDE GLNOID BSEPLT 6.5X30 (Shoulder) ×2 IMPLANT
PACK SHOULDER (CUSTOM PROCEDURE TRAY) ×2 IMPLANT
PAD COLD SHLDR WRAP-ON (PAD) ×1 IMPLANT
PROTECTOR NERVE ULNAR (MISCELLANEOUS) IMPLANT
RESTRAINT HEAD UNIVERSAL NS (MISCELLANEOUS) ×1 IMPLANT
RETRIEVER SUT HEWSON (MISCELLANEOUS) IMPLANT
SCREW BONE LOCKING RSP 5.0X14 (Screw) ×4 IMPLANT
SCREW BONE LOCKING RSP 5.0X30 (Screw) ×4 IMPLANT
SCREW BONE RSP LOCK 5X14 (Screw) IMPLANT
SCREW BONE RSP LOCK 5X30 (Screw) IMPLANT
SCREW RETAIN W/HEAD 4MM OFFSET (Shoulder) ×1 IMPLANT
SET HNDPC FAN SPRY TIP SCT (DISPOSABLE) ×1 IMPLANT
SLING ARM IMMOBILIZER LRG (SOFTGOODS) IMPLANT
SLING ARM IMMOBILIZER MED (SOFTGOODS) ×1 IMPLANT
SPONGE T-LAP 18X18 ~~LOC~~+RFID (SPONGE) ×2 IMPLANT
STEM HUMERAL SM SHELL SHOU 10 (Miscellaneous) IMPLANT
STRIP CLOSURE SKIN 1/2X4 (GAUZE/BANDAGES/DRESSINGS) ×4 IMPLANT
SUCTION FRAZIER HANDLE 10FR (MISCELLANEOUS)
SUCTION TUBE FRAZIER 10FR DISP (MISCELLANEOUS) IMPLANT
SUPPORT WRAP ARM LG (MISCELLANEOUS) IMPLANT
SUT ETHIBOND 2 V 37 (SUTURE) IMPLANT
SUT FIBERWIRE #2 38 REV NDL BL (SUTURE)
SUT MNCRL AB 4-0 PS2 18 (SUTURE) ×2 IMPLANT
SUT VIC AB 2-0 CT1 27 (SUTURE) ×4
SUT VIC AB 2-0 CT1 TAPERPNT 27 (SUTURE) ×1 IMPLANT
SUTURE FIBERWR#2 38 REV NDL BL (SUTURE) IMPLANT
TAPE LABRALWHITE 1.5X36 (TAPE) IMPLANT
TAPE SUT LABRALTAP WHT/BLK (SUTURE) IMPLANT
TOWEL OR 17X26 10 PK STRL BLUE (TOWEL DISPOSABLE) ×2 IMPLANT
TOWEL OR NON WOVEN STRL DISP B (DISPOSABLE) ×2 IMPLANT
WATER STERILE IRR 1000ML POUR (IV SOLUTION) ×3 IMPLANT

## 2021-03-27 NOTE — Op Note (Signed)
Procedure(s): REVERSE SHOULDER ARTHROPLASTY Procedure Note  Deborah Hanna female 70 y.o. 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 female  With advanced R shoulder arthritis with severe rotator cuff disease.  Pain and dysfunction interfered with quality of life and nonoperative treatment with activity modification, NSAIDS and injections failed.     Surgeon: Rhae Hammock   Assistants: Sheryle Hail PA-C [name] was present and scrubbed throughout the procedure and was essential in positioning, retraction, exposure, and closure)  Anesthesia: General endotracheal anesthesia with preoperative interscalene block given by the attending anesthesiologist     Procedure Detail  REVERSE SHOULDER ARTHROPLASTY   Estimated Blood Loss:  200 mL         Drains: none  Blood Given: none          Specimens: none        Complications:  * No complications entered in OR log *         Disposition: PACU - hemodynamically stable.         Condition: stable      OPERATIVE FINDINGS:  A DJO Altivate pressfit reverse total shoulder arthroplasty was placed with a  size 10 stem, a 32-4 glenosphere, and a standard-mm poly insert. The base plate  fixation was excellent.  PROCEDURE: The patient was identified in the preoperative holding area  where I personally marked the operative site after verifying site, side,  and procedure with the patient. An interscalene block given by  the attending anesthesiologist in the holding area and the patient was taken back to the operating room where all extremities were  carefully padded in position after general anesthesia was induced. She  was placed in a beach-chair position and the operative upper extremity was  prepped and draped in a standard sterile  fashion. An approximately 10-  cm incision was made from the tip of the coracoid process to the center  point of the humerus at the level of the axilla. Dissection was carried  down through subcutaneous tissues to the level of the cephalic vein  which was taken laterally with the deltoid. The pectoralis major was  retracted medially. The subdeltoid space was developed and the lateral  edge of the conjoined tendon was identified. The undersurface of  conjoined tendon was palpated and the musculocutaneous nerve was not in  the field. Retractor was placed underneath the conjoined and second  retractor was placed lateral into the deltoid. The circumflex humeral  artery and vessels were identified and clamped and coagulated. The  biceps tendon was tenotomized.  The subscapularis was taken down as a peel with the underlying capsule.  The  joint was then gently externally rotated while the capsule was released  from the humeral neck around to just beyond the 6 o'clock position. At  this point, the joint was dislocated and the humeral head was presented  into the wound. The excessive osteophyte formation was removed with a  large rongeur.  The cutting guide was used to make the appropriate  head cut and the head was saved for potentially bone grafting.  The glenoid was exposed with the arm in an  abducted extended position. The anterior and posterior labrum were  completely excised and the capsule was released circumferentially to  allow for exposure of the glenoid for preparation. The 2.5 mm drill was  placed using the guide in 5-10 inferior angulation and the tap was then advanced in the same hole. Small and large reamers were then used. The tap was then removed and the Metaglene was then screwed in with excellent purchase.  The peripheral guide was then used to drilled measured and filled peripheral locking screws. The size 32-4 glenosphere was then impacted on the [facility] taper and the central screw  was placed. The humerus was then again exposed and the diaphyseal reamers were used followed by the metaphyseal reamers. The final broach was left in place in the proximal trial was placed. The joint was reduced and with this implant it was felt that soft tissue tensioning was appropriate with excellent stability and excellent range of motion. Therefore, final humeral stem was placed press-fit with bone graft.  And then the trial polyethylene inserts were tested again and the above implant was felt to be the most appropriate for final insertion. The joint was reduced taken through full range of motion and felt to be stable. Soft tissue tension was appropriate.  The joint was then copiously irrigated with pulse  lavage and the wound was then closed. The subscapularis was repaired with 1 FiberWire through bone tunnels.  Skin was closed with 2-0 Vicryl in a deep dermal layer and 4-0  Monocryl for skin closure. Steri-Strips were applied. Sterile  dressings were then applied as well as a sling. The patient was allowed  to awaken from general anesthesia, transferred to stretcher, and taken  to recovery room in stable condition.   POSTOPERATIVE PLAN: The patient will be observed in the recovery room and as long as her pain is well controlled with the regional block and she is hemodynamically stable I think she would be safe to go home with family today.

## 2021-03-27 NOTE — Anesthesia Postprocedure Evaluation (Signed)
Anesthesia Post Note  Patient: Deborah Hanna  Procedure(s) Performed: REVERSE SHOULDER ARTHROPLASTY (Right: Shoulder)     Patient location during evaluation: PACU Anesthesia Type: Regional and General Level of consciousness: awake and alert Pain management: pain level controlled Vital Signs Assessment: post-procedure vital signs reviewed and stable Respiratory status: spontaneous breathing, nonlabored ventilation, respiratory function stable and patient connected to nasal cannula oxygen Cardiovascular status: blood pressure returned to baseline and stable Postop Assessment: no apparent nausea or vomiting Anesthetic complications: no   No notable events documented.  Last Vitals:  Vitals:   03/27/21 1200 03/27/21 1222  BP: 115/64 118/74  Pulse: 73 64  Resp: 14   Temp:    SpO2: 92% 95%    Last Pain:  Vitals:   03/27/21 1222  TempSrc:   PainSc: 0-No pain                 Nicko Daher

## 2021-03-27 NOTE — Discharge Instructions (Addendum)

## 2021-03-27 NOTE — Anesthesia Preprocedure Evaluation (Signed)
Anesthesia Evaluation  Patient identified by MRN, date of birth, ID band Patient awake    Reviewed: Allergy & Precautions, H&P , NPO status , Patient's Chart, lab work & pertinent test results, reviewed documented beta blocker date and time   Airway Mallampati: II  TM Distance: >3 FB Neck ROM: full    Dental no notable dental hx.    Pulmonary neg pulmonary ROS,    Pulmonary exam normal breath sounds clear to auscultation       Cardiovascular Exercise Tolerance: Good negative cardio ROS   Rhythm:regular Rate:Normal     Neuro/Psych  Headaches, PSYCHIATRIC DISORDERS Anxiety Depression    GI/Hepatic Neg liver ROS, GERD  Medicated,  Endo/Other  Hypothyroidism   Renal/GU negative Renal ROS  negative genitourinary   Musculoskeletal  (+) Arthritis , Osteoarthritis,    Abdominal   Peds  Hematology negative hematology ROS (+)   Anesthesia Other Findings   Reproductive/Obstetrics negative OB ROS                             Anesthesia Physical Anesthesia Plan  ASA: 2  Anesthesia Plan: General and Regional   Post-op Pain Management: Ofirmev IV (intra-op)   Induction: Intravenous  PONV Risk Score and Plan: 3 and Ondansetron, Dexamethasone and Midazolam  Airway Management Planned: Oral ETT  Additional Equipment: None  Intra-op Plan:   Post-operative Plan: Extubation in OR  Informed Consent: I have reviewed the patients History and Physical, chart, labs and discussed the procedure including the risks, benefits and alternatives for the proposed anesthesia with the patient or authorized representative who has indicated his/her understanding and acceptance.     Dental Advisory Given  Plan Discussed with: CRNA and Anesthesiologist  Anesthesia Plan Comments: (  )        Anesthesia Quick Evaluation

## 2021-03-27 NOTE — Progress Notes (Signed)
AssistedDr. Ambrose Pancoast with right, ultrasound guided, interscalene  block. Side rails up, monitors on throughout procedure. See vital signs in flow sheet. Tolerated Procedure well.

## 2021-03-27 NOTE — Anesthesia Procedure Notes (Addendum)
°  Anesthesia Regional Block: Interscalene brachial plexus block   Pre-Anesthetic Checklist: , timeout performed,  Correct Patient, Correct Site, Correct Laterality,  Correct Procedure, Correct Position, site marked,  Risks and benefits discussed,  Surgical consent,  Pre-op evaluation,  At surgeon's request and post-op pain management  Laterality: Right  Prep: chloraprep       Needles:  Injection technique: Single-shot  Needle Type: Echogenic Stimulator Needle     Needle Length: 5cm  Needle Gauge: 22     Additional Needles:   Procedures:, nerve stimulator,,, ultrasound used (permanent image in chart),,     Nerve Stimulator or Paresthesia:  Response: hand, 0.45 mA  Additional Responses:   Narrative:  Start time: 03/27/2021 8:35 AM End time: 03/27/2021 8:40 AM Injection made incrementally with aspirations every 5 mL.  Performed by: Personally  Anesthesiologist: Janeece Riggers, MD  Additional Notes: Functioning IV was confirmed and monitors were applied.  A 31mm 22ga Arrow echogenic stimulator needle was used. Sterile prep and drape,hand hygiene and sterile gloves were used. Ultrasound guidance: relevant anatomy identified, needle position confirmed, local anesthetic spread visualized around nerve(s)., vascular puncture avoided.  Image printed for medical record. Negative aspiration and negative test dose prior to incremental administration of local anesthetic. The patient tolerated the procedure well.

## 2021-03-27 NOTE — H&P (Signed)
Deborah Hanna is an 70 y.o. female.   Chief Complaint: Right shoulder pain and dysfunction HPI: 70 year old female with history of remote rotator cuff repair with chronic rotator cuff disease, advancing arthropathy and a large humeral head cyst, failed extensive conservative management.  Past Medical History:  Diagnosis Date   Anxiety    Arthritis    Depression    GERD (gastroesophageal reflux disease)    Headache    Hypothyroidism     Past Surgical History:  Procedure Laterality Date   ABDOMINAL HYSTERECTOMY     BLADDER REPAIR     CESAREAN SECTION     x 2   CHOLECYSTECTOMY     right ratator cuff repair       History reviewed. No pertinent family history. Social History:  reports that she has never smoked. She has never used smokeless tobacco. She reports that she does not drink alcohol and does not use drugs.  Allergies:  Allergies  Allergen Reactions   Contrast Media [Iodinated Diagnostic Agents] Nausea And Vomiting    Hx of     Medications Prior to Admission  Medication Sig Dispense Refill   escitalopram (LEXAPRO) 20 MG tablet Take 20 mg by mouth daily.     levothyroxine (SYNTHROID) 50 MCG tablet Take 50 mcg by mouth daily before breakfast.     meloxicam (MOBIC) 15 MG tablet Take 15 mg by mouth daily.     olopatadine (PATANOL) 0.1 % ophthalmic solution Place 1 drop into both eyes 2 (two) times daily. Pataday     omeprazole (PRILOSEC) 20 MG capsule Take 20 mg by mouth daily.     pravastatin (PRAVACHOL) 20 MG tablet Take 20 mg by mouth daily.     rizatriptan (MAXALT) 10 MG tablet Take 10 mg by mouth as needed for migraine. May repeat in 2 hours if needed     tiZANidine (ZANAFLEX) 4 MG tablet Take 4 mg by mouth every 6 (six) hours as needed for muscle spasms.     traZODone (DESYREL) 50 MG tablet Take 50 mg by mouth at bedtime.      No results found for this or any previous visit (from the past 48 hour(s)). No results found.  Review of Systems  All other systems  reviewed and are negative.  Blood pressure 128/65, pulse 63, temperature 97.8 F (36.6 C), temperature source Oral, resp. rate 16, weight 77.1 kg, SpO2 94 %. Physical Exam HENT:     Head: Atraumatic.  Eyes:     Extraocular Movements: Extraocular movements intact.  Cardiovascular:     Pulses: Normal pulses.  Pulmonary:     Effort: Pulmonary effort is normal.  Skin:    General: Skin is warm.  Neurological:     Mental Status: She is alert.  Psychiatric:        Mood and Affect: Mood normal.     Assessment/Plan 70 year old female with history of remote rotator cuff repair with chronic rotator cuff disease, advancing arthropathy and a large humeral head cyst, failed extensive conservative management. Plan right reverse total shoulder arthroplasty Risks / benefits of surgery discussed Consent on chart  NPO for OR Preop antibiotics   Rhae Hammock, MD 03/27/2021, 8:35 AM

## 2021-03-27 NOTE — Anesthesia Procedure Notes (Signed)
Procedure Name: Intubation Date/Time: 03/27/2021 9:18 AM Performed by: Claudia Desanctis, CRNA Pre-anesthesia Checklist: Patient identified, Emergency Drugs available, Suction available and Patient being monitored Patient Re-evaluated:Patient Re-evaluated prior to induction Oxygen Delivery Method: Circle system utilized Preoxygenation: Pre-oxygenation with 100% oxygen Induction Type: IV induction Ventilation: Mask ventilation without difficulty Laryngoscope Size: 2 and Miller Grade View: Grade I Tube type: Oral Tube size: 7.0 mm Number of attempts: 1 Airway Equipment and Method: Stylet Placement Confirmation: ETT inserted through vocal cords under direct vision, positive ETCO2 and breath sounds checked- equal and bilateral Secured at: 21 cm Tube secured with: Tape Dental Injury: Teeth and Oropharynx as per pre-operative assessment

## 2021-03-27 NOTE — Evaluation (Signed)
Occupational Therapy Evaluation Patient Details Name: Deborah Hanna MRN: [id number] DOB: 10/16/1950 Today's Date: 03/27/2021   History of Present Illness Patient s/p right rTSA   Clinical Impression   Deborah Hanna is a 70 year old woman s/p shoulder replacement without functional use of right dominant upper extremity secondary to effects of surgery and interscalene block and shoulder precautions. Therapist provided education and instruction to patient and spouse in regards to exercises, precautions, positioning, donning upper extremity clothing and bathing while maintaining shoulder precautions, ice and edema management and donning/doffing sling. Patient and spouse verbalized understanding and demonstrated as needed. Patient needed assistance to donn shirt, underwear, pants, socks and shoes and provided with instruction on compensatory strategies to perform ADLs. Patient to follow up with MD for further therapy needs.        Recommendations for follow up therapy are one component of a multi-disciplinary discharge planning process, led by the attending physician.  Recommendations may be updated based on patient status, additional functional criteria and insurance authorization.   Follow Up Recommendations  Follow physician's recommendations for discharge plan and follow up therapies    Assistance Recommended at Discharge Frequent or constant Supervision/Assistance  Functional Status Assessment  Patient has had a recent decline in their functional status and demonstrates the ability to make significant improvements in function in a reasonable and predictable amount of time.  Equipment Recommendations  None recommended by OT    Recommendations for Other Services       Precautions / Restrictions Precautions Precautions: Shoulder Type of Shoulder Precautions: No AROM, NoPROM Shoulder Interventions: Shoulder sling/immobilizer;At all times;Off for  dressing/bathing/exercises Precaution Booklet Issued:  (handouts) Required Braces or Orthoses: Sling Restrictions Weight Bearing Restrictions: Yes RUE Weight Bearing: Non weight bearing      Mobility Bed Mobility                    Transfers                          Balance                                           ADL either performed or assessed with clinical judgement   ADL                                               Vision Patient Visual Report: No change from baseline       Perception     Praxis      Pertinent Vitals/Pain Pain Assessment: Faces Faces Pain Scale: Hurts little more Pain Location: R axillaarea Pain Descriptors / Indicators: Grimacing;Sore Pain Intervention(s): Limited activity within patient's tolerance;Monitored during session     Hand Dominance Right   Extremity/Trunk Assessment Upper Extremity Assessment Upper Extremity Assessment: RUE deficits/detail RUE Deficits / Details: impaired secondary to effects of interscalene block   Lower Extremity Assessment Lower Extremity Assessment: Overall WFL for tasks assessed   Cervical / Trunk Assessment Cervical / Trunk Assessment: Normal   Communication     Cognition Arousal/Alertness: Awake/alert Behavior During Therapy: WFL for tasks assessed/performed Overall Cognitive Status: Within Functional Limits for tasks assessed  General Comments       Exercises     Shoulder Instructions Shoulder Instructions Donning/doffing shirt without moving shoulder: Caregiver independent with task Method for sponge bathing under operated UE: Independent Donning/doffing sling/immobilizer: Caregiver independent with task Correct positioning of sling/immobilizer: Independent ROM for elbow, wrist and digits of operated UE: Independent Sling wearing schedule (on at all times/off for ADL's):  Independent Proper positioning of operated UE when showering: Independent Dressing change: Independent Positioning of UE while sleeping: [place] expects to be discharged to:: Private residence Living Arrangements: Spouse/significant other                                      Prior Functioning/Environment Prior Level of Function : Independent/Modified Independent                        OT Problem List: Decreased strength;Decreased range of motion;Impaired UE functional use;Pain      OT Treatment/Interventions:      OT Goals(Current goals can be found in the care plan section) Acute Rehab OT Goals OT Goal Formulation: All assessment and education complete, DC therapy  OT Frequency:     Barriers to D/C:            Co-evaluation              AM-PAC OT "6 Clicks" Daily Activity     Outcome Measure Help from another person eating meals?: A Little Help from another person taking care of personal grooming?: A Little Help from another person toileting, which includes using toliet, bedpan, or urinal?: A Little Help from another person bathing (including washing, rinsing, drying)?: A Little Help from another person to put on and taking off regular upper body clothing?: A Lot Help from another person to put on and taking off regular lower body clothing?: A Lot 6 Click Score: 16   End of Session Nurse Communication:  (OT education complete)  Activity Tolerance: Patient tolerated treatment well Patient left: in chair;with family/visitor present  OT Visit Diagnosis: Pain Pain - Right/Left: Right Pain - part of body: Shoulder                Time: 1240-1301 OT Time Calculation (min): 21 min Charges:  OT General Charges $OT Visit: 1 Visit OT Evaluation $OT Eval Low Complexity: 1 Low  Dorina Ribaudo, OTR/L [place]  Office [phone] Pager: [phone]   Lenward Chancellor 03/27/2021, 2:13 PM

## 2021-03-27 NOTE — Transfer of Care (Signed)
Immediate Anesthesia Transfer of Care Note  Patient: Deborah Hanna  Procedure(s) Performed: REVERSE SHOULDER ARTHROPLASTY (Right: Shoulder)  Patient Location: PACU  Anesthesia Type:General  Level of Consciousness: awake and patient cooperative  Airway & Oxygen Therapy: Patient Spontanous Breathing and Patient connected to face mask  Post-op Assessment: Report given to RN and Post -op Vital signs reviewed and stable  Post vital signs: Reviewed and stable  Last Vitals:  Vitals Value Taken Time  BP 129/73 03/27/21 1048  Temp    Pulse 73 03/27/21 1052  Resp 16 03/27/21 1052  SpO2 99 % 03/27/21 1052  Vitals shown include unvalidated device data.  Last Pain:  Vitals:   03/27/21 0729  TempSrc:   PainSc: 5          Complications: No notable events documented.

## 2021-03-28 ENCOUNTER — Encounter (HOSPITAL_COMMUNITY): Payer: Self-pay | Admitting: Orthopedic Surgery

## 2021-08-08 ENCOUNTER — Encounter: Payer: Self-pay | Admitting: Gastroenterology

## 2021-09-05 ENCOUNTER — Encounter: Payer: Self-pay | Admitting: Gastroenterology

## 2021-09-05 ENCOUNTER — Ambulatory Visit (INDEPENDENT_AMBULATORY_CARE_PROVIDER_SITE_OTHER): Payer: PPO | Admitting: Gastroenterology

## 2021-09-05 VITALS — BP 132/78 | HR 72 | Ht 63.0 in | Wt 163.0 lb

## 2021-09-05 DIAGNOSIS — R1013 Epigastric pain: Secondary | ICD-10-CM | POA: Diagnosis not present

## 2021-09-05 DIAGNOSIS — R1310 Dysphagia, unspecified: Secondary | ICD-10-CM

## 2021-09-05 DIAGNOSIS — Z1211 Encounter for screening for malignant neoplasm of colon: Secondary | ICD-10-CM

## 2021-09-05 MED ORDER — SUTAB 1479-225-188 MG PO TABS: 1.0000 | ORAL_TABLET | ORAL | 0 refills | Status: DC

## 2021-09-05 NOTE — Patient Instructions (Addendum)
If you are age 70 or older, your body mass index should be between 23-30. Your Body mass index is 28.87 kg/m. If this is out of the aforementioned range listed, please consider follow up with your Primary Care Provider.  If you are age 38 or younger, your body mass index should be between 19-25. Your Body mass index is 28.87 kg/m. If this is out of the aformentioned range listed, please consider follow up with your Primary Care Provider.   ________________________________________________________  The [facility] providers would like to encourage you to use [facility] to communicate with providers for non-urgent requests or questions.  Due to long hold times on the telephone, sending your provider a message by [facility] may be a faster and more efficient way to get a response.  Please allow 48 business hours for a response.  Please remember that this is for non-urgent requests.  _______________________________________________________  Dennis Bast have been scheduled for an endoscopy and colonoscopy. Please follow the written instructions given to you at your visit today. Please pick up your prep supplies at the pharmacy within the next 1-3 days. If you use inhalers (even only as needed), please bring them with you on the day of your procedure.  We have sent the following medications to your pharmacy for you to pick up at your convenience: Sutab-you have been given a coupon for this  Continue omeprazole. Miralax 17g daily until large then as needed  Please call with any questions or concerns.  Thank you,  Dr. Jackquline Denmark

## 2021-09-05 NOTE — Progress Notes (Signed)
Chief Complaint: For GI eval.  Referring Provider:  Myrlene Broker, MD      ASSESSMENT AND PLAN;   #1. Epi pain with occ dysphagia  #2. Chronic constipation  #3. CRC screening   Plan: -EGD with dil/colon sutabs -Continue omeprazole '20mg'$  po QD -Miralax 17g po QD until large BM, then can use it every other day. -If still with problems and above WU is neg, would proceed with CT scan abdo/pelvis.   I discussed EGD/Colonoscopy- the indications, risks, alternatives and potential complications including, but not limited to bleeding, infection, reaction to meds, damage to internal organs, cardiac and/or pulmonary problems, and perforation requiring surgery. The possibility that significant findings could be missed was explained. All ? were answered. Pt consents to proceed.  HPI:    Deborah Hanna is a 71 y.o. 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  No melena or hematochezia.  Has abdominal bloating which gets better with BMs.  Had change in stool caliber.  Also with intermittent solid food dysphagia-mid chest.  No heartburn as long as she takes omeprazole 20 mg p.o. once a day.  Feels like it is time to "stretch esophagus again".  No odynophagia.  She denies having any regurgitation.  No fever chills or night sweats.  No recent weight loss.  She would like to get EGD and colonoscopy done at the same time.   Past GI procedures EGD with dil 12/19/2012 -Mild presbyesophagus s/p dil 48Fr. Neg Bx for EoE -Mild gastritis. Neg CLO  Colonoscopy 2014 ([facility]) -Left colonic diverticulosis predominantly in the sigmoid colon -Small internal hemorrhoids  Past Medical History:  Diagnosis Date   Anxiety    Arthritis    Depression    GERD (gastroesophageal reflux disease)    Headache    History of colon polyps    Hyperlipidemia    Hypothyroidism     Past  Surgical History:  Procedure Laterality Date   ABDOMINAL HYSTERECTOMY     BLADDER REPAIR     CESAREAN SECTION     x 2   CHOLECYSTECTOMY     COLONOSCOPY  11/14/2012   Left colonic diverticulosis predominantly in the sigmoid colon. Small internal hemorrhoids. Otherwise normal colonoscopy   ESOPHAGOGASTRODUODENOSCOPY  12/19/2012   Mild depress by esophagus. Status post esophageal dilatation. 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   Procedure: REVERSE SHOULDER ARTHROPLASTY;  Surgeon: Tania Ade, MD;  Location: [facility];  Service: Orthopedics;  Laterality: Right;   right ratator cuff repair      TONSILLECTOMY  1981    Family History  Problem Relation Age of Onset   Diabetes Mother    COPD Mother    Atrial fibrillation Mother    Liver cancer Father    Pancreatic disease Father    Colon cancer Neg Hx    Rectal cancer Neg Hx    Stomach cancer Neg Hx     Social History   Tobacco Use   Smoking status: Never   Smokeless tobacco: Never  Vaping Use   Vaping Use: Never used  Substance Use Topics   Alcohol use: Never   Drug use: Never    Current Outpatient Medications  Medication Sig Dispense Refill   escitalopram (LEXAPRO) 20 MG tablet Take 20 mg by mouth daily.     levothyroxine (SYNTHROID) 50 MCG tablet Take 50 mcg by mouth daily before  breakfast.     meloxicam (MOBIC) 15 MG tablet Take 15 mg by mouth daily.     olopatadine (PATANOL) 0.1 % ophthalmic solution Place 1 drop into both eyes 2 (two) times daily. Pataday     omeprazole (PRILOSEC) 20 MG capsule Take 20 mg by mouth daily.     pravastatin (PRAVACHOL) 20 MG tablet Take 20 mg by mouth daily.     rizatriptan (MAXALT) 10 MG tablet Take 10 mg by mouth as needed for migraine. May repeat in 2 hours if needed     tiZANidine (ZANAFLEX) 4 MG tablet Take 1 tablet (4 mg total) by mouth every 6 (six) hours as needed for muscle spasms. 30 tablet 0   traZODone (DESYREL) 50 MG tablet Take 50 mg by mouth at  bedtime.     No current facility-administered medications for this visit.    Allergies  Allergen Reactions   Contrast Media [Iodinated Contrast Media] Nausea And Vomiting    Hx of     Review of Systems:  Constitutional: Denies fever, chills, diaphoresis, appetite change and fatigue.  HEENT: Denies photophobia, eye pain, redness, hearing loss, ear pain, congestion, sore throat, rhinorrhea, sneezing, mouth sores, neck pain, neck stiffness and tinnitus.   Respiratory: Denies SOB, DOE, cough, chest tightness,  and wheezing.   Cardiovascular: Denies chest pain, palpitations and leg swelling.  Genitourinary: Denies dysuria, urgency, frequency, hematuria, flank pain and difficulty urinating.  Musculoskeletal: has myalgias, back pain, joint swelling, arthralgias and no gait problem.  Skin: No rash.  Neurological: Denies dizziness, seizures, syncope, weakness, light-headedness, numbness and headaches.  Hematological: Denies adenopathy. Easy bruising, personal or family bleeding history  Psychiatric/Behavioral: Has anxiety or depression     Physical Exam:    BP 132/78   Pulse 72   Ht '5\' 3"'$  (1.6 m)   Wt 163 lb (73.9 kg)   SpO2 96%   BMI 28.87 kg/m  Wt Readings from Last 3 Encounters:  09/05/21 163 lb (73.9 kg)  03/27/21 170 lb (77.1 kg)  03/24/21 170 lb (77.1 kg)   Constitutional:  Well-developed, in no acute distress. Psychiatric: Normal mood and affect. Behavior is normal. HEENT: Pupils normal.  Conjunctivae are normal. No scleral icterus. Cardiovascular: Normal rate, regular rhythm. No edema Pulmonary/chest: Effort normal and breath sounds normal. No wheezing, rales or rhonchi. Abdominal: Soft, nondistended. Nontender. Bowel sounds active throughout. There are no masses palpable. No hepatomegaly. Rectal: Deferred Neurological: Alert and oriented to person place and time. Skin: Skin is warm and dry. No rashes noted.  Data Reviewed: I have personally reviewed following labs and  imaging studies  CBC:    Latest Ref Rng & Units 03/24/2021   11:25 AM  CBC  WBC 4.0 - 10.5 K/uL 6.7    Hemoglobin 12.0 - 15.0 g/dL 16.1    Hematocrit 36.0 - 46.0 % 47.8    Platelets 150 - 400 K/uL 235      CMP:    Latest Ref Rng & Units 03/24/2021   11:25 AM  CMP  Glucose 70 - 99 mg/dL 98    BUN 8 - 23 mg/dL 15    Creatinine 0.44 - 1.00 mg/dL 0.71    Sodium 135 - 145 mmol/L 139    Potassium 3.5 - 5.1 mmol/L 4.4    Chloride 98 - 111 mmol/L 103    CO2 22 - 32 mmol/L 26    Calcium 8.9 - 10.3 mg/dL 9.8        Carmell Austria, MD 09/05/2021, 1:59  PM  Cc: Myrlene Broker, MD

## 2021-10-20 ENCOUNTER — Encounter: Payer: Self-pay | Admitting: Gastroenterology

## 2021-10-20 ENCOUNTER — Ambulatory Visit (AMBULATORY_SURGERY_CENTER): Payer: PPO | Admitting: Gastroenterology

## 2021-10-20 VITALS — BP 106/63 | HR 64 | Temp 97.7°F | Resp 13 | Ht 63.0 in | Wt 163.0 lb

## 2021-10-20 DIAGNOSIS — Z1211 Encounter for screening for malignant neoplasm of colon: Secondary | ICD-10-CM

## 2021-10-20 DIAGNOSIS — D123 Benign neoplasm of transverse colon: Secondary | ICD-10-CM | POA: Diagnosis not present

## 2021-10-20 DIAGNOSIS — R1310 Dysphagia, unspecified: Secondary | ICD-10-CM | POA: Diagnosis not present

## 2021-10-20 DIAGNOSIS — D122 Benign neoplasm of ascending colon: Secondary | ICD-10-CM

## 2021-10-20 DIAGNOSIS — K2960 Other gastritis without bleeding: Secondary | ICD-10-CM | POA: Diagnosis not present

## 2021-10-20 DIAGNOSIS — R1013 Epigastric pain: Secondary | ICD-10-CM | POA: Diagnosis not present

## 2021-10-20 DIAGNOSIS — K2950 Unspecified chronic gastritis without bleeding: Secondary | ICD-10-CM | POA: Diagnosis not present

## 2021-10-20 DIAGNOSIS — K2980 Duodenitis without bleeding: Secondary | ICD-10-CM | POA: Diagnosis not present

## 2021-10-20 MED ORDER — SODIUM CHLORIDE 0.9 % IV SOLN: 500.0000 mL | INTRAVENOUS | Status: AC

## 2021-10-20 NOTE — Progress Notes (Signed)
Pt in recovery with monitors in place, VSS. Report given to receiving RN. Bite guard was placed with pt awake to ensure comfort. No dental or soft tissue damage noted. 

## 2021-10-20 NOTE — Progress Notes (Signed)
Chief Complaint: For GI eval.  Referring Provider:  Myrlene Broker, MD      ASSESSMENT AND PLAN;   #1. Epi pain with occ dysphagia  #2. Chronic constipation  #3. CRC screening   Plan: -EGD with dil/colon sutabs -Continue omeprazole '20mg'$  po QD -Miralax 17g po QD until large BM, then can use it every other day. -If still with problems and above WU is neg, would proceed with CT scan abdo/pelvis.   I discussed EGD/Colonoscopy- the indications, risks, alternatives and potential complications including, but not limited to bleeding, infection, reaction to meds, damage to internal organs, cardiac and/or pulmonary problems, and perforation requiring surgery. The possibility that significant findings could be missed was explained. All ? were answered. Pt consents to proceed.  HPI:    Deborah Hanna is a 71 y.o. 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  No melena or hematochezia.  Has abdominal bloating which gets better with BMs.  Had change in stool caliber.  Also with intermittent solid food dysphagia-mid chest.  No heartburn as long as she takes omeprazole 20 mg p.o. once a day.  Feels like it is time to "stretch esophagus again".  No odynophagia.  She denies having any regurgitation.  No fever chills or night sweats.  No recent weight loss.  She would like to get EGD and colonoscopy done at the same time.   Past GI procedures EGD with dil 12/19/2012 -Mild presbyesophagus s/p dil 48Fr. Neg Bx for EoE -Mild gastritis. Neg CLO  Colonoscopy 2014 ([facility]) -Left colonic diverticulosis predominantly in the sigmoid colon -Small internal hemorrhoids  Past Medical History:  Diagnosis Date   Anxiety    Arthritis    Depression    GERD (gastroesophageal reflux disease)    Headache    History of colon polyps    Hyperlipidemia    Hypothyroidism     Past  Surgical History:  Procedure Laterality Date   ABDOMINAL HYSTERECTOMY     BLADDER REPAIR     CESAREAN SECTION     x 2   CHOLECYSTECTOMY     COLONOSCOPY  11/14/2012   Left colonic diverticulosis predominantly in the sigmoid colon. Small internal hemorrhoids. Otherwise normal colonoscopy   ESOPHAGOGASTRODUODENOSCOPY  12/19/2012   Mild depress by esophagus. Status post esophageal dilatation. 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   Procedure: REVERSE SHOULDER ARTHROPLASTY;  Surgeon: Tania Ade, MD;  Location: [facility];  Service: Orthopedics;  Laterality: Right;   right ratator cuff repair      TONSILLECTOMY  1981    Family History  Problem Relation Age of Onset   Diabetes Mother    COPD Mother    Atrial fibrillation Mother    Liver cancer Father    Pancreatic disease Father    Colon cancer Neg Hx    Rectal cancer Neg Hx    Stomach cancer Neg Hx     Social History   Tobacco Use   Smoking status: Never   Smokeless tobacco: Never  Vaping Use   Vaping Use: Never used  Substance Use Topics   Alcohol use: Never   Drug use: Never    Current Outpatient Medications  Medication Sig Dispense Refill   escitalopram (LEXAPRO) 20 MG tablet Take 20 mg by mouth daily.     levothyroxine (SYNTHROID) 50 MCG tablet Take 50 mcg by mouth daily before  breakfast.     olopatadine (PATANOL) 0.1 % ophthalmic solution Place 1 drop into both eyes 2 (two) times daily. Pataday     omeprazole (PRILOSEC) 20 MG capsule Take 20 mg by mouth daily.     pravastatin (PRAVACHOL) 20 MG tablet Take 20 mg by mouth daily.     traZODone (DESYREL) 50 MG tablet Take 50 mg by mouth at bedtime.     meloxicam (MOBIC) 15 MG tablet Take 15 mg by mouth daily.     rizatriptan (MAXALT) 10 MG tablet Take 10 mg by mouth as needed for migraine. May repeat in 2 hours if needed     tiZANidine (ZANAFLEX) 4 MG tablet Take 1 tablet (4 mg total) by mouth every 6 (six) hours as needed for muscle spasms.  30 tablet 0   Current Facility-Administered Medications  Medication Dose Route Frequency Provider Last Rate Last Admin   0.9 %  sodium chloride infusion  500 mL Intravenous Continuous Jackquline Denmark, MD        Allergies  Allergen Reactions   Morphine Other (See Comments)    Chest pain   Contrast Media [Iodinated Contrast Media] Nausea And Vomiting    Hx of     Review of Systems:  Constitutional: Denies fever, chills, diaphoresis, appetite change and fatigue.  HEENT: Denies photophobia, eye pain, redness, hearing loss, ear pain, congestion, sore throat, rhinorrhea, sneezing, mouth sores, neck pain, neck stiffness and tinnitus.   Respiratory: Denies SOB, DOE, cough, chest tightness,  and wheezing.   Cardiovascular: Denies chest pain, palpitations and leg swelling.  Genitourinary: Denies dysuria, urgency, frequency, hematuria, flank pain and difficulty urinating.  Musculoskeletal: has myalgias, back pain, joint swelling, arthralgias and no gait problem.  Skin: No rash.  Neurological: Denies dizziness, seizures, syncope, weakness, light-headedness, numbness and headaches.  Hematological: Denies adenopathy. Easy bruising, personal or family bleeding history  Psychiatric/Behavioral: Has anxiety or depression     Physical Exam:    BP 124/65   Pulse 61   Temp 97.7 F (36.5 C)   Ht '5\' 3"'$  (1.6 m)   Wt 163 lb (73.9 kg)   SpO2 97%   BMI 28.87 kg/m  Wt Readings from Last 3 Encounters:  10/20/21 163 lb (73.9 kg)  09/05/21 163 lb (73.9 kg)  03/27/21 170 lb (77.1 kg)   Constitutional:  Well-developed, in no acute distress. Psychiatric: Normal mood and affect. Behavior is normal. HEENT: Pupils normal.  Conjunctivae are normal. No scleral icterus. Cardiovascular: Normal rate, regular rhythm. No edema Pulmonary/chest: Effort normal and breath sounds normal. No wheezing, rales or rhonchi. Abdominal: Soft, nondistended. Nontender. Bowel sounds active throughout. There are no masses  palpable. No hepatomegaly. Rectal: Deferred Neurological: Alert and oriented to person place and time. Skin: Skin is warm and dry. No rashes noted.  Data Reviewed: I have personally reviewed following labs and imaging studies  CBC:    Latest Ref Rng & Units 03/24/2021   11:25 AM  CBC  WBC 4.0 - 10.5 K/uL 6.7   Hemoglobin 12.0 - 15.0 g/dL 16.1   Hematocrit 36.0 - 46.0 % 47.8   Platelets 150 - 400 K/uL 235     CMP:    Latest Ref Rng & Units 03/24/2021   11:25 AM  CMP  Glucose 70 - 99 mg/dL 98   BUN 8 - 23 mg/dL 15   Creatinine 0.44 - 1.00 mg/dL 0.71   Sodium 135 - 145 mmol/L 139   Potassium 3.5 - 5.1 mmol/L 4.4   Chloride  98 - 111 mmol/L 103   CO2 22 - 32 mmol/L 26   Calcium 8.9 - 10.3 mg/dL 9.8       Carmell Austria, MD 10/20/2021, 1:54 PM  Cc: Myrlene Broker, MD

## 2021-10-20 NOTE — Patient Instructions (Signed)
YOU HAD AN ENDOSCOPIC PROCEDURE TODAY AT [facility]:   Refer to the procedure report that was given to you for any specific questions about what was found during the examination.  If the procedure report does not answer your questions, please call your gastroenterologist to clarify.  If you requested that your care partner not be given the details of your procedure findings, then the procedure report has been included in a sealed envelope for you to review at your convenience later.  YOU SHOULD EXPECT: Some feelings of bloating in the abdomen. Passage of more gas than usual.  Walking can help get rid of the air that was put into your GI tract during the procedure and reduce the bloating. If you had a lower endoscopy (such as a colonoscopy or flexible sigmoidoscopy) you may notice spotting of blood in your stool or on the toilet paper. If you underwent a bowel prep for your procedure, you may not have a normal bowel movement for a few days.  Please Note:  You might notice some irritation and congestion in your nose or some drainage.  This is from the oxygen used during your procedure.  There is no need for concern and it should clear up in a day or so.  SYMPTOMS TO REPORT IMMEDIATELY:  Following lower endoscopy (colonoscopy or flexible sigmoidoscopy):  Excessive amounts of blood in the stool  Significant tenderness or worsening of abdominal pains  Swelling of the abdomen that is new, acute  Fever of 100F or higher  Following upper endoscopy (EGD)  Vomiting of blood or coffee ground material  New chest pain or pain under the shoulder blades  Painful or persistently difficult swallowing  New shortness of breath  Fever of 100F or higher  Black, tarry-looking stools  For urgent or emergent issues, a gastroenterologist can be reached at any hour by calling [phone]. Do not use MyChart messaging for urgent concerns.    DIET:  Follow a Post Dilation Diet (see handout given  to you by your recovery nurse): Nothing to eat or drink until 3:30pm, then Clear Liquids ONLY from 3:30pm to 4:30pm, then a soft diet for the rest of the day today beginning at 4:30pm. You may then proceed to your regular diet as tolerated tomorrow morning. Drink plenty of fluids but you should avoid alcoholic beverages for 24 hours. In general, follow a High Fiber Diet.  MEDICATIONS: Continue present medications.  Please see handouts given to you by your recovery nurse.  Thank you for allowing [place] to provide for your healthcare needs today.  ACTIVITY:  You should plan to take it easy for the rest of today and you should NOT DRIVE or use heavy machinery until tomorrow (because of the sedation medicines used during the test).    FOLLOW UP: Our staff will call the number listed on your records the next business day following your procedure.  We will call around 7:15- 8:00 am to check on you and address any questions or concerns that you may have regarding the information given to you following your procedure. If we do not reach you, we will leave a message.  If you develop any symptoms (ie: fever, flu-like symptoms, shortness of breath, cough etc.) before then, please call [phone].  If you test positive for Covid 19 in the 2 weeks post procedure, please call and report this information to [place].    If any biopsies were taken you will be contacted by phone or by letter  within the next 1-3 weeks.  Please call us at [phone] if you have not heard about the biopsies in 3 weeks.    SIGNATURES/CONFIDENTIALITY: You and/or your care partner have signed paperwork which will be entered into your electronic medical record.  These signatures attest to the fact that that the information above on your After Visit Summary has been reviewed and is understood.  Full responsibility of the confidentiality of this discharge information lies with you and/or your care-partner.

## 2021-10-20 NOTE — Op Note (Signed)
[facility] Patient Name: Deborah Hanna Procedure Date: 10/20/2021 1:57 PM MRN: [id number] Endoscopist: Jackquline Denmark , MD Age: 71 Referring MD:  Date of Birth: Jan 19, 1951 Gender: Female Account #: [id number] Procedure:                Colonoscopy Indications:              Screening for colorectal malignant neoplasm Medicines:                Monitored Anesthesia Care Procedure:                Pre-Anesthesia Assessment:                           - Prior to the procedure, a History and Physical                            was performed, and patient medications and                            allergies were reviewed. The patient's tolerance of                            previous anesthesia was also reviewed. The risks                            and benefits of the procedure and the sedation                            options and risks were discussed with the patient.                            All questions were answered, and informed consent                            was obtained. Prior Anticoagulants: The patient has                            taken no previous anticoagulant or antiplatelet                            agents. ASA Grade Assessment: II - A patient with                            mild systemic disease. After reviewing the risks                            and benefits, the patient was deemed in                            satisfactory condition to undergo the procedure.                           After obtaining informed consent, the colonoscope  was passed under direct vision. Throughout the                            procedure, the patient's blood pressure, pulse, and                            oxygen saturations were monitored continuously. The                            Olympus PCF-H190DL (#[id number]) Colonoscope was                            introduced through the anus and advanced to the the                            cecum, identified  by appendiceal orifice and                            ileocecal valve. The colonoscopy was performed                            without difficulty. The patient tolerated the                            procedure well. The quality of the bowel                            preparation was good. The ileocecal valve,                            appendiceal orifice, and rectum were photographed. Scope In: 2:12:57 PM Scope Out: 2:31:26 PM Scope Withdrawal Time: 0 hours 10 minutes 3 seconds  Total Procedure Duration: 0 hours 18 minutes 29 seconds  Findings:                 Two sessile polyps were found in the proximal                            transverse colon and mid ascending colon. The                            polyps were 6 mm in size. These polyps were removed                            with a cold snare. Resection and retrieval were                            complete.                           Multiple medium-mouthed diverticula were found in                            the sigmoid colon. Some luminal narrowing  consistent with muscular hypertrophy. No endoscopic                            evidence of diverticulitis.                           Non-bleeding internal hemorrhoids were found during                            retroflexion. The hemorrhoids were moderate and                            Grade I (internal hemorrhoids that do not prolapse).                           The exam was otherwise without abnormality on                            direct and retroflexion views. Complications:            No immediate complications. Estimated Blood Loss:     Estimated blood loss: none. Impression:               - Two 6 mm polyps in the proximal transverse colon                            and in the mid ascending colon, removed with a cold                            snare. Resected and retrieved.                           - Moderate predominantly sigmoid diverticulosis.                            - Non-bleeding internal hemorrhoids.                           - The examination was otherwise normal on direct                            and retroflexion views. Recommendation:           - Patient has a contact number available for                            emergencies. The signs and symptoms of potential                            delayed complications were discussed with the                            patient. Return to normal activities tomorrow.                            Written discharge instructions were provided to the  patient.                           - High fiber diet.                           - Continue present medications.                           - Await pathology results.                           - Repeat colonoscopy for surveillance based on                            pathology results.                           - The findings and recommendations were discussed                            with the patient's family. Jackquline Denmark, MD 10/20/2021 2:36:59 PM This report has been signed electronically.

## 2021-10-20 NOTE — Op Note (Signed)
[facility] Patient Name: Deborah Hanna Procedure Date: 10/20/2021 1:58 PM MRN: [id number] Endoscopist: Jackquline Denmark , MD Age: 71 Referring MD:  Date of Birth: 1950/09/25 Gender: Female Account #: [id number] Procedure:                Upper GI endoscopy Indications:              Dysphagia. Epi pain Medicines:                Monitored Anesthesia Care Procedure:                Pre-Anesthesia Assessment:                           - Prior to the procedure, a History and Physical                            was performed, and patient medications and                            allergies were reviewed. The patient's tolerance of                            previous anesthesia was also reviewed. The risks                            and benefits of the procedure and the sedation                            options and risks were discussed with the patient.                            All questions were answered, and informed consent                            was obtained. Prior Anticoagulants: The patient has                            taken no previous anticoagulant or antiplatelet                            agents. ASA Grade Assessment: II - A patient with                            mild systemic disease. After reviewing the risks                            and benefits, the patient was deemed in                            satisfactory condition to undergo the procedure.                           After obtaining informed consent, the endoscope was  passed under direct vision. Throughout the                            procedure, the patient's blood pressure, pulse, and                            oxygen saturations were monitored continuously. The                            Endoscope was introduced through the mouth, and                            advanced to the second part of duodenum. The upper                            GI endoscopy was accomplished  without difficulty.                            The patient tolerated the procedure well. Scope In: Scope Out: Findings:                 The lower third of the esophagus was mildly                            tortuous with well-defined Z-line at 35 cm,                            examined by NBI. The scope was withdrawn. Dilation                            was performed with a Maloney dilator with mild                            resistance at 50 Fr. esophageal biopsies were not                            performed since previous biopsies were negative for                            EoE                           Localized minimal inflammation characterized by                            erythema was found in the gastric antrum. Biopsies                            were taken with a cold forceps for histology.                           The examined duodenum was normal. Biopsies for  histology were taken with a cold forceps for                            evaluation of celiac disease. Complications:            No immediate complications. Estimated Blood Loss:     Estimated blood loss: none. Impression:               - Mild presbyesophagus s/p dilatation.                           - Gastritis. Biopsied.                           - Normal examined duodenum. Biopsied. Recommendation:           - Patient has a contact number available for                            emergencies. The signs and symptoms of potential                            delayed complications were discussed with the                            patient. Return to normal activities tomorrow.                            Written discharge instructions were provided to the                            patient.                           - Post dilatation diet.                           - Continue present medications.                           - Await pathology results.                           - The findings and  recommendations were discussed                            with the patient's family. Jackquline Denmark, MD 10/20/2021 2:11:29 PM This report has been signed electronically.

## 2021-10-20 NOTE — Progress Notes (Signed)
Called to room to assist during endoscopic procedure.  Patient ID and intended procedure confirmed with present staff. Received instructions for my participation in the procedure from the performing physician.  

## 2021-10-21 ENCOUNTER — Telehealth: Payer: Self-pay

## 2021-10-21 NOTE — Telephone Encounter (Signed)
Follow up call placed, VM box not set up and RN unable to leave message. SChaplin, RN,BSN

## 2021-11-01 ENCOUNTER — Encounter: Payer: Self-pay | Admitting: Gastroenterology

## 2022-06-01 IMAGING — DX DG SHOULDER 2+V PORT*R*
1 series · 1 of 1 positions shown · non-contrast
Comparison: December 18, 2020.

CLINICAL DATA: Status post reversed right total shoulder
arthroplasty.

EXAM:
PORTABLE RIGHT SHOULDER

[shoulder ap]
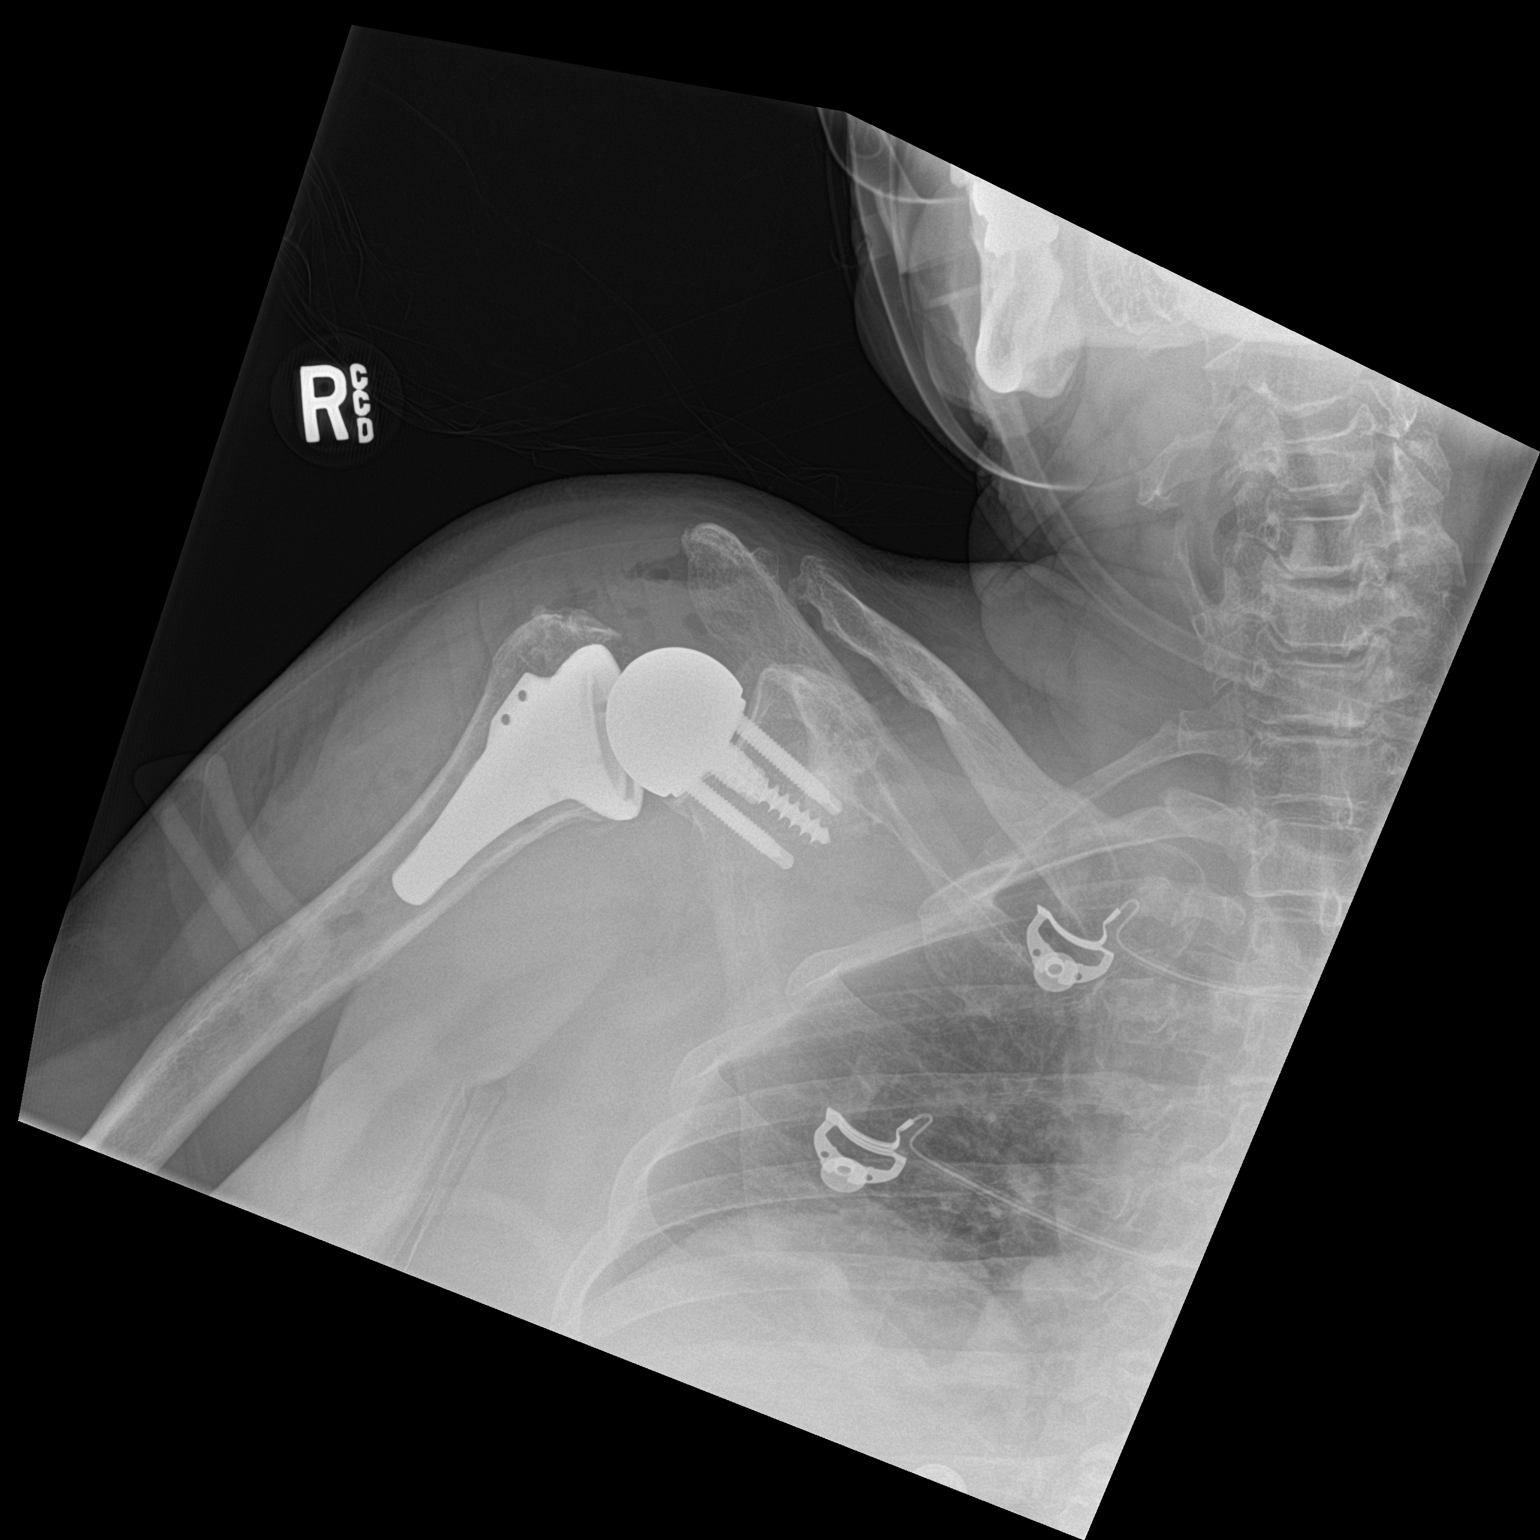

[1 of 1 positions shown; findings below may reference images not displayed]

FINDINGS: Right glenoid and humeral components are well situated. Expected
postoperative changes are seen surrounding soft tissues.
IMPRESSION: Status post reversed right total shoulder arthroplasty.
